# Patient Record
Sex: Male | Born: 1965
Health system: Southern US, Community
[De-identification: ages and names within clinical notes are randomized; demographics above are authoritative.]

## PROBLEM LIST (undated history)

## (undated) DIAGNOSIS — Z87442 Personal history of urinary calculi: Secondary | ICD-10-CM

## (undated) DIAGNOSIS — K5792 Diverticulitis of intestine, part unspecified, without perforation or abscess without bleeding: Secondary | ICD-10-CM

## (undated) HISTORY — PX: EYE SURGERY: SHX253

## (undated) HISTORY — PX: NO PAST SURGERIES: SHX2092

## (undated) HISTORY — PX: COLONOSCOPY: SHX174

## (undated) HISTORY — PX: REFRACTIVE SURGERY: SHX103

---

## 2000-09-15 ENCOUNTER — Encounter: Payer: Self-pay | Admitting: Gastroenterology

## 2000-09-15 ENCOUNTER — Ambulatory Visit (HOSPITAL_COMMUNITY): Admission: RE | Admit: 2000-09-15 | Discharge: 2000-09-15 | Payer: Self-pay | Admitting: Gastroenterology

## 2002-05-11 ENCOUNTER — Emergency Department (HOSPITAL_COMMUNITY): Admission: EM | Admit: 2002-05-11 | Discharge: 2002-05-11 | Payer: Self-pay | Admitting: Emergency Medicine

## 2002-05-11 ENCOUNTER — Encounter: Payer: Self-pay | Admitting: Emergency Medicine

## 2004-09-05 ENCOUNTER — Emergency Department (HOSPITAL_COMMUNITY): Admission: EM | Admit: 2004-09-05 | Discharge: 2004-09-05 | Payer: Self-pay | Admitting: Family Medicine

## 2008-09-17 ENCOUNTER — Emergency Department (HOSPITAL_COMMUNITY): Admission: EM | Admit: 2008-09-17 | Discharge: 2008-09-17 | Payer: Self-pay | Admitting: Emergency Medicine

## 2011-11-11 ENCOUNTER — Encounter: Payer: Self-pay | Admitting: Emergency Medicine

## 2011-11-11 ENCOUNTER — Other Ambulatory Visit: Payer: Self-pay | Admitting: Emergency Medicine

## 2011-11-11 ENCOUNTER — Ambulatory Visit (INDEPENDENT_AMBULATORY_CARE_PROVIDER_SITE_OTHER): Payer: 59 | Admitting: Emergency Medicine

## 2011-11-11 VITALS — BP 152/94 | HR 90 | Temp 98.3°F | Resp 16 | Ht 73.0 in | Wt 238.6 lb

## 2011-11-11 DIAGNOSIS — K635 Polyp of colon: Secondary | ICD-10-CM

## 2011-11-11 DIAGNOSIS — Z0000 Encounter for general adult medical examination without abnormal findings: Secondary | ICD-10-CM

## 2011-11-11 LAB — CBC WITH DIFFERENTIAL/PLATELET
Basophils Absolute: 0 10*3/uL (ref 0.0–0.1)
Basophils Relative: 1 % (ref 0–1)
Eosinophils Absolute: 0.1 10*3/uL (ref 0.0–0.7)
Eosinophils Relative: 4 % (ref 0–5)
HCT: 38.8 % — ABNORMAL LOW (ref 39.0–52.0)
Hemoglobin: 13.8 g/dL (ref 13.0–17.0)
Lymphocytes Relative: 48 % — ABNORMAL HIGH (ref 12–46)
Lymphs Abs: 1.7 10*3/uL (ref 0.7–4.0)
MCH: 30.7 pg (ref 26.0–34.0)
MCHC: 35.6 g/dL (ref 30.0–36.0)
MCV: 86.2 fL (ref 78.0–100.0)
Monocytes Absolute: 0.5 10*3/uL (ref 0.1–1.0)
Monocytes Relative: 14 % — ABNORMAL HIGH (ref 3–12)
Neutro Abs: 1.2 10*3/uL — ABNORMAL LOW (ref 1.7–7.7)
Neutrophils Relative %: 33 % — ABNORMAL LOW (ref 43–77)
Platelets: 219 10*3/uL (ref 150–400)
RBC: 4.5 MIL/uL (ref 4.22–5.81)
RDW: 13.6 % (ref 11.5–15.5)
WBC: 3.6 10*3/uL — ABNORMAL LOW (ref 4.0–10.5)

## 2011-11-11 LAB — LIPID PANEL
Cholesterol: 159 mg/dL (ref 0–200)
HDL: 41 mg/dL (ref >39)
LDL Cholesterol: 99 mg/dL (ref 0–99)
Total CHOL/HDL Ratio: 3.9 Ratio
Triglycerides: 93 mg/dL (ref <150)
VLDL: 19 mg/dL (ref 0–40)

## 2011-11-11 LAB — IFOBT (OCCULT BLOOD): IFOBT: NEGATIVE

## 2011-11-11 NOTE — Progress Notes (Signed)
@[email or web address] @  Patient ID: Matthew Daniel MRN: [id number], DOB: 1965-06-06 46 y.o. Date of Encounter: 11/11/2011, 5:36 PM  Primary Physician: No primary provider on file.  Chief Complaint: Physical (CPE)  HPI: 46 y.o. y/o male with history noted below here for CPE.  Doing well. No issues/complaints.  Review of Systems:  Consitutional: No fever, chills, fatigue, night sweats, lymphadenopathy, or weight changes. Eyes: No visual changes, eye redness, or discharge. ENT/Mouth: Ears: No otalgia, tinnitus, hearing loss, discharge. Nose: No congestion, rhinorrhea, sinus pain, or epistaxis. Throat: No sore throat, post nasal drip, or teeth pain. Cardiovascular: No CP, palpitations, diaphoresis, DOE, edema, orthopnea, PND. Respiratory: No cough, hemoptysis, SOB, or wheezing. Gastrointestinal: No anorexia, dysphagia, reflux, pain, nausea, vomiting, hematemesis, diarrhea, constipation, BRBPR, or melena. Genitourinary: No dysuria, frequency, urgency, hematuria, incontinence, nocturia, decreased urinary stream, discharge, impotence, or testicular pain/masses. Musculoskeletal: No decreased ROM, myalgias, stiffness, joint swelling, or weakness. Skin: No rash, erythema, lesion changes, pain, warmth, jaundice, or pruritis. Neurological: No headache, dizziness, syncope, seizures, tremors, memory loss, coordination problems, or paresthesias. Psychological: No anxiety, depression, hallucinations, SI/HI. Endocrine: No fatigue, polydipsia, polyphagia, polyuria, or known diabetes. All other systems were reviewed and are otherwise negative.  History reviewed. No pertinent past medical history.   History reviewed. No pertinent past surgical history.  Home Meds:  Prior to Admission medications   Not on File    Allergies:  Allergies  Allergen Reactions  . Sulfa Antibiotics     Hives    History   Social History  . Marital Status: Single    Spouse Name: N/A    Number of Children: N/A  . Years of  Education: N/A   Occupational History  . Not on file.   Social History Main Topics  . Smoking status: Never Smoker   . Smokeless tobacco: Not on file  . Alcohol Use: No  . Drug Use: No  . Sexually Active: Not on file   Other Topics Concern  . Not on file   Social History Narrative  . No narrative on file    History reviewed. No pertinent family history.  Physical Exam:  Blood pressure 152/94, pulse 90, temperature 98.3 F (36.8 C), temperature source Oral, resp. rate 16, height 6\' 1"  (1.854 m), weight 238 lb 9.6 oz (108.228 kg), SpO2 97.00%.  General: Well developed, well nourished, in no acute distress. HEENT: Normocephalic, atraumatic. Conjunctiva pink, sclera non-icteric. Pupils 2 mm constricting to 1 mm, round, regular, and equally reactive to light and accomodation. EOMI. Internal auditory canal clear. TMs with good cone of light and without pathology. Nasal mucosa pink. Nares are without discharge. No sinus tenderness. Oral mucosa pink. Dentition normal. Pharynx without exudate.   Neck: Supple. Trachea midline. No thyromegaly. Full ROM. No lymphadenopathy. Lungs: Clear to auscultation bilaterally without wheezes, rales, or rhonchi. Breathing is of normal effort and unlabored. Cardiovascular: RRR with S1 S2. No murmurs, rubs, or gallops appreciated. Distal pulses 2+ symmetrically. No carotid or abdominal bruits.  Abdomen: Soft, non-tender, non-distended with normoactive bowel sounds. No hepatosplenomegaly or masses. No rebound/guarding. No CVA tenderness. Without hernias.  Rectal: No external hemorrhoids or fissures. Rectal vault without masses.   Genitourinary:   circumcised male. No penile lesions. Testes descended bilaterally, and smooth without tenderness or masses.  Musculoskeletal: Full range of motion and 5/5 strength throughout. Without swelling, atrophy, tenderness, crepitus, or warmth. Extremities without clubbing, cyanosis, or edema. Calves supple. Skin: Warm and  moist without erythema, ecchymosis, wounds, or rash. Neuro: A+Ox3. CN II-XII grossly  intact. Moves all extremities spontaneously. Full sensation throughout. Normal gait. DTR 2+ throughout upper and lower extremities. Finger to nose intact. Psych:  Responds to questions appropriately with a normal affect.  The patient had 2 mm skin tenderness mid abdomen. This area was prepped with Betadine with half cc of 2% plain removed with iris scissors with the base cauterized with silver nitrate. The lesion was not sent. This was discussed with the patient and he was very comfortable without having lesion sent for pathology. Patient tolerated the procedure well Studies: CBC, CMET, Lipid, PSA, TSH,  all pending.   Assessment/Plan:  46 y.o. y/o  male here for CPE. His physical exam was normal. I did remove a small skin tag from his midabdomen. He has a history of colon polyps and has not had followup an appointment was made with Dr. Matthias Hughs. Patient knows his weight is too high and he is to exercise more. He is no other high-risk behaviors does not smoke drink and is not sexually active. -  Signed, Earl Lites, MD 11/11/2011 5:36 PM

## 2011-11-12 LAB — TSH: TSH: 1.145 u[IU]/mL (ref 0.350–4.500)

## 2011-11-12 LAB — HIV ANTIBODY (ROUTINE TESTING W REFLEX): HIV: NONREACTIVE

## 2011-11-12 LAB — PSA: PSA: 0.72 ng/mL (ref <=4.00)

## 2012-03-27 ENCOUNTER — Other Ambulatory Visit: Payer: Self-pay

## 2012-05-14 ENCOUNTER — Encounter: Payer: Self-pay | Admitting: Emergency Medicine

## 2012-10-14 ENCOUNTER — Emergency Department (HOSPITAL_COMMUNITY)
Admission: EM | Admit: 2012-10-14 | Discharge: 2012-10-14 | Disposition: A | Discharge status: Home / Self Care | Payer: 59 | Attending: Emergency Medicine | Admitting: Emergency Medicine

## 2012-10-14 ENCOUNTER — Encounter (HOSPITAL_COMMUNITY): Payer: Self-pay | Admitting: Emergency Medicine

## 2012-10-14 ENCOUNTER — Emergency Department (HOSPITAL_COMMUNITY): Payer: 59

## 2012-10-14 DIAGNOSIS — K5732 Diverticulitis of large intestine without perforation or abscess without bleeding: Secondary | ICD-10-CM | POA: Insufficient documentation

## 2012-10-14 DIAGNOSIS — K5792 Diverticulitis of intestine, part unspecified, without perforation or abscess without bleeding: Secondary | ICD-10-CM

## 2012-10-14 LAB — URINALYSIS, ROUTINE W REFLEX MICROSCOPIC
Glucose, UA: NEGATIVE mg/dL
Ketones, ur: 15 mg/dL — AB
Nitrite: POSITIVE — AB
Protein, ur: 30 mg/dL — AB
Specific Gravity, Urine: 1.028 (ref 1.005–1.030)
Urobilinogen, UA: 1 mg/dL (ref 0.0–1.0)
pH: 5.5 (ref 5.0–8.0)

## 2012-10-14 LAB — URINE MICROSCOPIC-ADD ON

## 2012-10-14 LAB — CBC WITH DIFFERENTIAL/PLATELET
Basophils Absolute: 0 10*3/uL (ref 0.0–0.1)
Basophils Relative: 0 % (ref 0–1)
Eosinophils Absolute: 0 10*3/uL (ref 0.0–0.7)
Eosinophils Relative: 0 % (ref 0–5)
HCT: 42.6 % (ref 39.0–52.0)
Hemoglobin: 15.2 g/dL (ref 13.0–17.0)
Lymphocytes Relative: 16 % (ref 12–46)
Lymphs Abs: 2.4 10*3/uL (ref 0.7–4.0)
MCH: 30.6 pg (ref 26.0–34.0)
MCHC: 35.7 g/dL (ref 30.0–36.0)
MCV: 85.9 fL (ref 78.0–100.0)
Monocytes Absolute: 2.5 10*3/uL — ABNORMAL HIGH (ref 0.1–1.0)
Monocytes Relative: 16 % — ABNORMAL HIGH (ref 3–12)
Neutro Abs: 10.3 10*3/uL — ABNORMAL HIGH (ref 1.7–7.7)
Neutrophils Relative %: 68 % (ref 43–77)
Platelets: 208 10*3/uL (ref 150–400)
RBC: 4.96 MIL/uL (ref 4.22–5.81)
RDW: 13.2 % (ref 11.5–15.5)
WBC: 15.3 10*3/uL — ABNORMAL HIGH (ref 4.0–10.5)

## 2012-10-14 LAB — COMPREHENSIVE METABOLIC PANEL
ALT: 35 U/L (ref 0–53)
AST: 21 U/L (ref 0–37)
Albumin: 4 g/dL (ref 3.5–5.2)
Alkaline Phosphatase: 55 U/L (ref 39–117)
BUN: 15 mg/dL (ref 6–23)
CO2: 22 mEq/L (ref 19–32)
Calcium: 8.9 mg/dL (ref 8.4–10.5)
Chloride: 100 mEq/L (ref 96–112)
Creatinine, Ser: 1.47 mg/dL — ABNORMAL HIGH (ref 0.50–1.35)
GFR calc Af Amer: 64 mL/min — ABNORMAL LOW (ref >90)
GFR calc non Af Amer: 56 mL/min — ABNORMAL LOW (ref >90)
Glucose, Bld: 118 mg/dL — ABNORMAL HIGH (ref 70–99)
Potassium: 3.7 mEq/L (ref 3.5–5.1)
Sodium: 137 mEq/L (ref 135–145)
Total Bilirubin: 1.6 mg/dL — ABNORMAL HIGH (ref 0.3–1.2)
Total Protein: 7.5 g/dL (ref 6.0–8.3)

## 2012-10-14 LAB — LIPASE, BLOOD: Lipase: 30 U/L (ref 11–59)

## 2012-10-14 MED ORDER — ONDANSETRON HCL 4 MG/2ML IJ SOLN
4.0000 mg | Freq: Once | INTRAMUSCULAR | Status: AC
  Administered 2012-10-14: 4 mg via INTRAVENOUS

## 2012-10-14 MED ORDER — METRONIDAZOLE IN NACL 5-0.79 MG/ML-% IV SOLN
500.0000 mg | Freq: Once | INTRAVENOUS | Status: AC
  Administered 2012-10-14: 500 mg via INTRAVENOUS
  Filled 2012-10-14: qty 100

## 2012-10-14 MED ORDER — CIPROFLOXACIN HCL 500 MG PO TABS: 500.0000 mg | ORAL_TABLET | Freq: Two times a day (BID) | ORAL | Status: DC

## 2012-10-14 MED ORDER — FENTANYL CITRATE 0.05 MG/ML IJ SOLN
50.0000 ug | Freq: Once | INTRAMUSCULAR | Status: AC
  Administered 2012-10-14: 50 ug via INTRAVENOUS
  Filled 2012-10-14: qty 2

## 2012-10-14 MED ORDER — MORPHINE SULFATE 4 MG/ML IJ SOLN
4.0000 mg | Freq: Once | INTRAMUSCULAR | Status: AC
  Administered 2012-10-14: 4 mg via INTRAVENOUS

## 2012-10-14 MED ORDER — IOHEXOL 300 MG/ML  SOLN
25.0000 mL | INTRAMUSCULAR | Status: AC
  Administered 2012-10-14: 25 mL via ORAL

## 2012-10-14 MED ORDER — ONDANSETRON 4 MG PO TBDP: 4.0000 mg | ORAL_TABLET | Freq: Three times a day (TID) | ORAL | Status: DC | PRN

## 2012-10-14 MED ORDER — CIPROFLOXACIN IN D5W 400 MG/200ML IV SOLN
400.0000 mg | Freq: Once | INTRAVENOUS | Status: AC
  Administered 2012-10-14: 400 mg via INTRAVENOUS
  Filled 2012-10-14: qty 200

## 2012-10-14 MED ORDER — METRONIDAZOLE 500 MG PO TABS: 500.0000 mg | ORAL_TABLET | Freq: Two times a day (BID) | ORAL | Status: DC

## 2012-10-14 MED ORDER — OXYCODONE-ACETAMINOPHEN 5-325 MG PO TABS: 2.0000 | ORAL_TABLET | ORAL | Status: DC | PRN

## 2012-10-14 MED ORDER — IOHEXOL 300 MG/ML  SOLN
100.0000 mL | Freq: Once | INTRAMUSCULAR | Status: AC | PRN
  Administered 2012-10-14: 100 mL via INTRAVENOUS

## 2012-10-14 NOTE — ED Provider Notes (Signed)
CSN: [id number]     Arrival date & time 10/14/12  0134 History   First MD Initiated Contact with Patient 10/14/12 [phone]     Chief Complaint  Patient presents with  . Abdominal Pain   (Consider location/radiation/quality/duration/timing/severity/associated sxs/prior Treatment) HPI 47 year old male presents to emergency room complaining of onset of lower perimbilical abdominal pain starting yesterday.  He reports normal bowel movement yesterday.  Patient ate a hotdog from a gas station about 2 days ago, and thinks he may have food poisoning.  No nausea no vomiting until arriving in the emergency department tonight.  Pain has been sharp, and severe.  No prior abdominal surgeries.  He reports colonoscopy about 5-6 months ago, which showed diverticulosis and a polyp.  No urinary symptoms, no penile discharge  History reviewed. No pertinent past medical history. Past Surgical History  Procedure Laterality Date  . Refractive surgery     No family history on file. History  Substance Use Topics  . Smoking status: Never Smoker   . Smokeless tobacco: Not on file  . Alcohol Use: No    Review of Systems  All other systems reviewed and are negative.    Allergies  Sulfa antibiotics  Home Medications   Current Outpatient Rx  Name  Route  Sig  Dispense  Refill  . Polyethyl Glycol-Propyl Glycol (SYSTANE PRESERVATIVE FREE) 0.4-0.3 % SOLN   Ophthalmic   Apply 1 drop to eye every hour as needed (dry eyes).          There were no vitals taken for this visit. Physical Exam  Nursing note and vitals reviewed. Constitutional: He is oriented to person, place, and time. He appears well-developed and well-nourished. He appears distressed.  HENT:  Head: Normocephalic and atraumatic.  Nose: Nose normal.  Mouth/Throat: Oropharynx is clear and moist.  Eyes: Conjunctivae and EOM are normal. Pupils are equal, round, and reactive to light.  Neck: Normal range of motion. Neck supple. No JVD present. No  tracheal deviation present. No thyromegaly present.  Cardiovascular: Normal rate, regular rhythm, normal heart sounds and intact distal pulses.  Exam reveals no gallop and no friction rub.   No murmur heard. Pulmonary/Chest: Effort normal and breath sounds normal. No stridor. No respiratory distress. He has no wheezes. He has no rales. He exhibits no tenderness.  Abdominal: Soft. Bowel sounds are normal. He exhibits no distension and no mass. There is tenderness. There is rebound. There is no guarding.  Patient with diffuse abdominal pain to lower abdomen, worse on the right lower quadrant and periumbilical.  He has some mild rebound.  No guarding  Musculoskeletal: Normal range of motion. He exhibits no edema and no tenderness.  Lymphadenopathy:    He has no cervical adenopathy.  Neurological: He is alert and oriented to person, place, and time. He exhibits normal muscle tone. Coordination normal.  Skin: Skin is warm and dry. No rash noted. No erythema. No pallor.  Psychiatric: He has a normal mood and affect. His behavior is normal. Judgment and thought content normal.    ED Course  Procedures (including critical care time) Labs Review Labs Reviewed  CBC WITH DIFFERENTIAL - Abnormal; Notable for the following:    WBC 15.3 (*)    Neutro Abs 10.3 (*)    Monocytes Relative 16 (*)    Monocytes Absolute 2.5 (*)    All other components within normal limits  COMPREHENSIVE METABOLIC PANEL - Abnormal; Notable for the following:    Glucose, Bld 118 (*)  Creatinine, Ser 1.47 (*)    Total Bilirubin 1.6 (*)    GFR calc non Af Amer 56 (*)    GFR calc Af Amer 64 (*)    All other components within normal limits  URINALYSIS, ROUTINE W REFLEX MICROSCOPIC - Abnormal; Notable for the following:    Color, Urine AMBER (*)    APPearance CLOUDY (*)    Hgb urine dipstick MODERATE (*)    Bilirubin Urine SMALL (*)    Ketones, ur 15 (*)    Protein, ur 30 (*)    Nitrite POSITIVE (*)    Leukocytes, UA  SMALL (*)    All other components within normal limits  URINE MICROSCOPIC-ADD ON - Abnormal; Notable for the following:    Bacteria, UA FEW (*)    Casts GRANULAR CAST (*)    All other components within normal limits  URINE CULTURE  LIPASE, BLOOD   Imaging Review Ct Abdomen Pelvis W Contrast  10/14/2012   *RADIOLOGY REPORT*  Clinical Data: Lower abdominal pain.  Concern for appendicitis.  CT ABDOMEN AND PELVIS WITH CONTRAST  Technique:  Multidetector CT imaging of the abdomen and pelvis was performed following the standard protocol during bolus administration of intravenous contrast.  Contrast: OMNIPAQUE IOHEXOL 300 MG/ML  SOLN  Comparison: 07/08/2007.  Findings:  BODY WALL: Unremarkable.  LOWER CHEST:  Mediastinum: Unremarkable.  Lungs/pleura: No consolidation.  ABDOMEN/PELVIS:  Liver: Diffuse low attenuation consistent with steatosis.  Tiny low attenuation focus in the dome of the right lobe, nonspecific.  Biliary: No evidence of biliary obstruction or stone.  Pancreas: Unremarkable.  Spleen: Unremarkable.  Adrenals: Unremarkable.  Kidneys and ureters: No hydronephrosis or stone. Sub centimeter low attenuation foci in the lower pole right kidney are too small to characterize.  Small focus of interpolar right renal scarring.  Bladder: Unremarkable.  Bowel: Focal sigmoid colonic wall thickening with infiltration of the neighboring fat.  This is in a region with multiple diverticula.  No extraluminal gas or abscess.  No bowel obstruction. Normal appendix.  Retroperitoneum: No mass or adenopathy.  Reproductive: Unremarkable.  Vascular: No acute abnormality.  OSSEOUS: No acute abnormalities.  IMPRESSION: 1. Sigmoid diverticulitis without abscess or perforation.  2. Hepatic steatosis.   Original Report Authenticated By: Tiburcio Pea    MDM   1. Diverticulitis     47 year old male with worsening lower abdominal pain.  Differential includes appendicitis, diverticulitis, colitis, pylonephritis.  He  has elevated wbc, and urine showing possible infection.  Will get CT abd pelvis    Olivia Mackie, MD 10/14/12 [phone]

## 2012-10-14 NOTE — ED Notes (Signed)
Pt reports generalized abdominal pain after eating hot dog sandwich sold at a gas station 2 days ago , denies emesis or diarrhea.

## 2012-10-14 NOTE — ED Notes (Signed)
Pt returned from CT °

## 2012-10-15 LAB — URINE CULTURE: Colony Count: 5000

## 2012-12-16 ENCOUNTER — Other Ambulatory Visit: Payer: Self-pay

## 2013-07-26 ENCOUNTER — Ambulatory Visit (INDEPENDENT_AMBULATORY_CARE_PROVIDER_SITE_OTHER): Payer: 59 | Admitting: Family Medicine

## 2013-07-26 ENCOUNTER — Encounter: Payer: Self-pay | Admitting: Family Medicine

## 2013-07-26 VITALS — BP 134/82 | HR 77 | Temp 98.3°F | Resp 18 | Ht 72.5 in | Wt 235.0 lb

## 2013-07-26 DIAGNOSIS — R109 Unspecified abdominal pain: Secondary | ICD-10-CM

## 2013-07-26 DIAGNOSIS — D649 Anemia, unspecified: Secondary | ICD-10-CM

## 2013-07-26 DIAGNOSIS — K612 Anorectal abscess: Secondary | ICD-10-CM

## 2013-07-26 DIAGNOSIS — K610 Anal abscess: Secondary | ICD-10-CM

## 2013-07-26 LAB — POCT CBC
Granulocyte percent: 53.6 %G (ref 37–80)
HCT, POC: 40.1 % — AB (ref 43.5–53.7)
Hemoglobin: 13 g/dL — AB (ref 14.1–18.1)
Lymph, poc: 2.2 (ref 0.6–3.4)
MCH, POC: 30.4 pg (ref 27–31.2)
MCHC: 32.4 g/dL (ref 31.8–35.4)
MCV: 93.7 fL (ref 80–97)
MID (cbc): 0.5 (ref 0–0.9)
MPV: 8.1 fL (ref 0–99.8)
POC Granulocyte: 3.1 (ref 2–6.9)
POC LYMPH PERCENT: 37.9 %L (ref 10–50)
POC MID %: 8.5 %M (ref 0–12)
Platelet Count, POC: 266 10*3/uL (ref 142–424)
RBC: 4.28 M/uL — AB (ref 4.69–6.13)
RDW, POC: 13.8 %
WBC: 5.8 10*3/uL (ref 4.6–10.2)

## 2013-07-26 MED ORDER — AMOXICILLIN-POT CLAVULANATE 875-125 MG PO TABS: 1.0000 | ORAL_TABLET | Freq: Two times a day (BID) | ORAL | Status: DC

## 2013-07-26 NOTE — Patient Instructions (Addendum)
You have a draining, small abscess near your anus.  This may have developed from a hemorrhoid.  In any case you had already started to drain, and we opened it up a bit more for easier drainage. Use the augmentin antibiotic and do warm tub soaks a few times a day.  Pat dry gently afterwards.  Keep your stool soft and do not strain to have a BM. Let me or Dr. Sarina Ser know if you are not feeling better soon!

## 2013-07-26 NOTE — Progress Notes (Signed)
[facility] [address] [place] [address] [phone]  Date:  07/26/2013   Name:  Matthew Daniel   DOB:  02/03/1966   MRN:  [id number]  PCP:  No PCP Per Patient    Chief Complaint: Hemorrhoids   History of Present Illness:  Matthew Daniel is a 48 y.o. very pleasant male patient who presents with the following:  He is here today with a painful hemorrhoid.  He noted some bleeding just today- trace of bright red blood on TP when he wiped.  No pain with BM.  He had a "dull pain," but got worse about one week ago.  He did exercise yesterday and felt much worse afterwards.   He is generally healthy His GI doctor is Dr. Watt Climes- he had an early colonoscopy due to constipation.   He has not had any abdominal pain or nausea/ vomiting.  He did have a "flare up" of his diverticulitis about a week ago.  He has these sx on occasion.  He went on a soft diet and his sx cleared up.   He has not noted a fever No diarrhea. He is still able to have a BM ok.  He has not been constipated here recently.    Patient Active Problem List   Diagnosis Date Noted  . Colon polyps 11/11/2011    No past medical history on file.  Past Surgical History  Procedure Laterality Date  . Refractive surgery    . Eye surgery      History  Substance Use Topics  . Smoking status: Never Smoker   . Smokeless tobacco: Not on file  . Alcohol Use: No    History reviewed. No pertinent family history.  Allergies  Allergen Reactions  . Sulfa Antibiotics Hives    Medication list has been reviewed and updated.  No current outpatient prescriptions on file prior to visit.   No current facility-administered medications on file prior to visit.    Review of Systems:  As per HPI- otherwise negative.   Physical Examination: Filed Vitals:   07/26/13 1439  BP: 134/82  Pulse: 77  Temp: 98.3 F (36.8 C)  Resp: 18   Filed Vitals:   07/26/13 1439  Height: 6' 0.5" (1.842 m)   Weight: 235 lb (106.595 kg)   Body mass index is 31.42 kg/(m^2). Ideal Body Weight: Weight in (lb) to have BMI = 25: 186.5  GEN: WDWN, NAD, Non-toxic, A & O x 3, overweight, looks well HEENT: Atraumatic, Normocephalic. Neck supple. No masses, No LAD. Ears and Nose: No external deformity. CV: RRR, No M/G/R. No JVD. No thrill. No extra heart sounds. PULM: CTA B, no wheezes, crackles, rhonchi. No retractions. No resp. distress. No accessory muscle use. ABD: S, ND +BS. No rebound. No HSM.  He does have minimal tenderness in the epigastric and RLQ areas.   EXTR: No c/c/e NEURO Normal gait.  PSYCH: Normally interactive. Conversant. Not depressed or anxious appearing.  Calm demeanor.  Rectal: he has a small, already draining abscess at 12:00 to the anus, not involving the anal sphincter.  Rectal exam does not reveal any internal hemorrhoids or tracking.    VC obtained.  Area prepped with peroxide and alcohol.  Small amount of 1% lidocaein used to numb area, I and D with 11 blade. Tolerated well   Results for orders placed in visit on 07/26/13  POCT CBC      Result Value Ref Range   WBC  5.8  4.6 - 10.2 K/uL   Lymph, poc 2.2  0.6 - 3.4   POC LYMPH PERCENT 37.9  10 - 50 %L   MID (cbc) 0.5  0 - 0.9   POC MID % 8.5  0 - 12 %M   POC Granulocyte 3.1  2 - 6.9   Granulocyte percent 53.6  37 - 80 %G   RBC 4.28 (*) 4.69 - 6.13 M/uL   Hemoglobin 13.0 (*) 14.1 - 18.1 g/dL   HCT, POC 40.1 (*) 43.5 - 53.7 %   MCV 93.7  80 - 97 fL   MCH, POC 30.4  27 - 31.2 pg   MCHC 32.4  31.8 - 35.4 g/dL   RDW, POC 13.8     Platelet Count, POC 266  142 - 424 K/uL   MPV 8.1  0 - 99.8 fL    Assessment and Plan: Perianal abscess - Plan: amoxicillin-clavulanate (AUGMENTIN) 875-125 MG per tablet  Abdominal pain, other specified site - Plan: POCT CBC  Here today with a small peri-anal abscess.  Already draining some, performed I and D to assist with further drainage.  Will treat with augmentin, sitz baths.  He  will let me know if not doing well.  Recent "diverticulitis flare" which he dx and treated at home, now with mild abdominal tenderness as above.  His normal CBC is reassuring but not a guarantee of benign process.  Offered a CT scan to absolutely rule- out appendicitis or severe diverticulitis.  He declines for now but will keep a close eye on his sx and seek care if not continuing to improve  Signed Lamar Blinks, MD

## 2013-09-03 ENCOUNTER — Ambulatory Visit (INDEPENDENT_AMBULATORY_CARE_PROVIDER_SITE_OTHER): Payer: 59 | Admitting: Family Medicine

## 2013-09-03 VITALS — BP 144/78 | HR 76 | Temp 98.1°F | Resp 18 | Ht 72.0 in | Wt 242.4 lb

## 2013-09-03 DIAGNOSIS — K5900 Constipation, unspecified: Secondary | ICD-10-CM

## 2013-09-03 DIAGNOSIS — K625 Hemorrhage of anus and rectum: Secondary | ICD-10-CM

## 2013-09-03 DIAGNOSIS — K612 Anorectal abscess: Secondary | ICD-10-CM

## 2013-09-03 NOTE — Progress Notes (Signed)
Subjective:    Patient ID: Matthew Daniel, male    DOB: April 17, 1965, 48 y.o.   MRN: [id number]  HPI Chief Complaint  Patient presents with  . Rectal Pain    had an anal abscess drained last time he was here, says hes having irritation and pain, also blood when he wipes    This chart was scribed for Matthew Ray, MD by Thea Alken, ED Scribe. This patient was seen in room 1 and the patient's care was started at 9:03 AM.  HPI Comments: Matthew Daniel is a 48 y.o. male with who presents to the [facility] complaining of rectal pain. Pt states he was seen 4-5 weeks ago by Dr. Edilia Bo for anal abscess which was drained at that time. He reports since drainage he's had a tinge of blood with wiping without blood clots. He reports worsening heavy rectal bleeding 1 days ago along with constipation. He reports rectal area is sore. Pt states he has been using wet flushable wipes for area. Pt report he has miralax at home for frequent constipation. Pt denies taking miralax for constipation as of lately. Pt reports h/o diverticulitis and was last seen by GI, Dr, Watt Climes in September. Pt denies fever chills nausea and emesis.   Pt was seen June 16 for a small draining abscess at 12 o'clock which did not involve anal fincture. On exam no apparent tracking or hemorrhoids. Pt was treated with Augmentin after I&D and futher drainage.   Patient Active Problem List   Diagnosis Date Noted  . Colon polyps 11/11/2011   History reviewed. No pertinent past medical history. Past Surgical History  Procedure Laterality Date  . Refractive surgery    . Eye surgery     Allergies  Allergen Reactions  . Sulfa Antibiotics Hives   Prior to Admission medications   Not on File   History   Social History  . Marital Status: Single    Spouse Name: N/A    Number of Children: N/A  . Years of Education: N/A   Occupational History  . Not on file.   Social History Main Topics  .  Smoking status: Never Smoker   . Smokeless tobacco: Not on file  . Alcohol Use: No  . Drug Use: No  . Sexual Activity: Not on file   Other Topics Concern  . Not on file   Social History Narrative  . No narrative on file   Review of Systems  Constitutional: Negative for fever and chills.  Gastrointestinal: Positive for constipation, anal bleeding and rectal pain. Negative for nausea, vomiting and abdominal pain.       Objective:   Physical Exam  Nursing note and vitals reviewed. Constitutional: He is oriented to person, place, and time. He appears well-developed and well-nourished. No distress.  HENT:  Head: Normocephalic and atraumatic.  Eyes: Conjunctivae and EOM are normal.  Neck: Neck supple.  Cardiovascular: Normal rate.   Pulmonary/Chest: Effort normal.  Abdominal: He exhibits no distension. There is no tenderness.  Genitourinary: Rectal exam shows no fissure.  12 o'clock 1 cm above anus there is a small opening with dried blood but no surrounding induration, erythema, or discharge. No fistula noted with digital rectal exam. no bleeding around anus.   Musculoskeletal: Normal range of motion.  Neurological: He is alert and oriented to person, place, and time.  Skin: Skin is warm and dry.  Psychiatric: He has a normal mood and affect. His behavior is normal.  Filed Vitals:   09/03/13 0840  BP: 144/78  Pulse: 76  Temp: 98.1 F (36.7 C)  TempSrc: Oral  Resp: 18  Height: 6' (1.829 m)  Weight: 242 lb 6.4 oz (109.952 kg)  SpO2: 98%      Assessment & Plan:  DACOTA RUBEN is a 48 y.o. male Rectal bleeding - Plan: Ambulatory referral to General Surgery  Abscess of anal or rectal region - Plan: Ambulatory referral to General Surgery  Unspecified constipation - Plan: Ambulatory referral to General Surgery  Hx of constipation, recent small amount of blood with wiping, increased today, but painless rectal bleeding. Prior area of abscess with small residual  opening, but no deeper signs of involvement on exam today, as no induration/fluctuance or discharge form area. No fistula palpated on exam.  DDX also includes other common causes of rectal bleeding with constipation like anal fissure or internal hemorrhoid.   - colace or miralax for constipation, avoid straining.   - sitz baths if needed  -refer to general surgery for eval of area, as prior abscess.   -rtc precautions.    No orders of the defined types were placed in this encounter.   Patient Instructions  We will refer you to general surgeon next week.  Warm soaks if needed, return if any increased pain, swelling or discharge in this area. Colace or Miralax if needed for constipation to lessen straining.  Return to the clinic or go to the nearest emergency room if any of your symptoms worsen or new symptoms occur.  Constipation Constipation is when a person has fewer than three bowel movements a week, has difficulty having a bowel movement, or has stools that are dry, hard, or larger than normal. As people grow older, constipation is more common. If you try to fix constipation with medicines that make you have a bowel movement (laxatives), the problem may get worse. Long-term laxative use may cause the muscles of the colon to become weak. A low-fiber diet, not taking in enough fluids, and taking certain medicines may make constipation worse.  CAUSES   Certain medicines, such as antidepressants, pain medicine, iron supplements, antacids, and water pills.   Certain diseases, such as diabetes, irritable bowel syndrome (IBS), thyroid disease, or depression.   Not drinking enough water.   Not eating enough fiber-rich foods.   Stress or travel.   Lack of physical activity or exercise.   Ignoring the urge to have a bowel movement.   Using laxatives too much.  SIGNS AND SYMPTOMS   Having fewer than three bowel movements a week.   Straining to have a bowel movement.   Having  stools that are hard, dry, or larger than normal.   Feeling full or bloated.   Pain in the lower abdomen.   Not feeling relief after having a bowel movement.  DIAGNOSIS  Your health care provider will take a medical history and perform a physical exam. Further testing may be done for severe constipation. Some tests may include:  A barium enema X-Daniel to examine your rectum, colon, and, sometimes, your small intestine.   A sigmoidoscopy to examine your lower colon.   A colonoscopy to examine your entire colon. TREATMENT  Treatment will depend on the severity of your constipation and what is causing it. Some dietary treatments include drinking more fluids and eating more fiber-rich foods. Lifestyle treatments may include regular exercise. If these diet and lifestyle recommendations do not help, your health care provider may recommend taking over-the-counter laxative  medicines to help you have bowel movements. Prescription medicines may be prescribed if over-the-counter medicines do not work.  HOME CARE INSTRUCTIONS   Eat foods that have a lot of fiber, such as fruits, vegetables, whole grains, and beans.  Limit foods high in fat and processed sugars, such as french fries, hamburgers, cookies, candies, and soda.   A fiber supplement may be added to your diet if you cannot get enough fiber from foods.   Drink enough fluids to keep your urine clear or pale yellow.   Exercise regularly or as directed by your health care provider.   Go to the restroom when you have the urge to go. Do not hold it.   Only take over-the-counter or prescription medicines as directed by your health care provider. Do not take other medicines for constipation without talking to your health care provider first.  [place] IF:   You have bright red blood in your stool.   Your constipation lasts for more than 4 days or gets worse.   You have abdominal or rectal pain.   You have  thin, pencil-like stools.   You have unexplained weight loss. MAKE SURE YOU:   Understand these instructions.  Will watch your condition.  Will get help right away if you are not doing well or get worse. Document Released: 10/26/2003 Document Revised: 02/01/2013 Document Reviewed: 11/08/2012 [facility] Patient Information 2015 [place], [place]. This information is not intended to replace advice given to you by your health care provider. Make sure you discuss any questions you have with your health care provider.   Rectal Bleeding Rectal bleeding is when blood passes out of the anus. It is usually a sign that something is wrong. It may not be serious, but it should always be evaluated. Rectal bleeding may present as bright red blood or extremely dark stools. The color may range from dark red or maroon to black (like tar). It is important that the cause of rectal bleeding be identified so treatment can be started and the problem corrected. CAUSES   Hemorrhoids. These are enlarged (dilated) blood vessels or veins in the anal or rectal area.  Fistulas. Theseare abnormal, burrowing channels that usually run from inside the rectum to the skin around the anus. They can bleed.  Anal fissures. This is a tear in the tissue of the anus. Bleeding occurs with bowel movements.  Diverticulosis. This is a condition in which pockets or sacs project from the bowel wall. Occasionally, the sacs can bleed.  Diverticulitis. Thisis an infection involving diverticulosis of the colon.  Proctitis and colitis. These are conditions in which the rectum, colon, or both, can become inflamed and pitted (ulcerated).  Polyps and cancer. Polyps are non-cancerous (benign) growths in the colon that may bleed. Certain types of polyps turn into cancer.  Protrusion of the rectum. Part of the rectum can project from the anus and bleed.  Certain medicines.  Intestinal infections.  Blood vessel abnormalities. HOME CARE  INSTRUCTIONS  Eat a high-fiber diet to keep your stool soft.  Limit activity.  Drink enough fluids to keep your urine clear or pale yellow.  Warm baths may be useful to soothe rectal pain.  Follow up with your caregiver as directed. SEEK IMMEDIATE MEDICAL CARE IF:  You develop increased bleeding.  You have black or dark red stools.  You vomit blood or material that looks like coffee grounds.  You have abdominal pain or tenderness.  You have a fever.  You feel  weak, nauseous, or you faint.  You have severe rectal pain or you are unable to have a bowel movement. MAKE SURE YOU:  Understand these instructions.  Will watch your condition.  Will get help right away if you are not doing well or get worse. Document Released: 07/19/2001 Document Revised: 04/21/2011 Document Reviewed: 07/14/2010 [facility] Patient Information 2015 [place], [place]. This information is not intended to replace advice given to you by your health care provider. Make sure you discuss any questions you have with your health care provider.     I personally performed the services described in this documentation, which was scribed in my presence. The recorded information has been reviewed and considered, and addended by me as needed.

## 2013-09-03 NOTE — Patient Instructions (Signed)
We will refer you to general surgeon next week.  Warm soaks if needed, return if any increased pain, swelling or discharge in this area. Colace or Miralax if needed for constipation to lessen straining.  Return to the clinic or go to the nearest emergency room if any of your symptoms worsen or new symptoms occur.  Constipation Constipation is when a person has fewer than three bowel movements a week, has difficulty having a bowel movement, or has stools that are dry, hard, or larger than normal. As people grow older, constipation is more common. If you try to fix constipation with medicines that make you have a bowel movement (laxatives), the problem may get worse. Long-term laxative use may cause the muscles of the colon to become weak. A low-fiber diet, not taking in enough fluids, and taking certain medicines may make constipation worse.  CAUSES   Certain medicines, such as antidepressants, pain medicine, iron supplements, antacids, and water pills.   Certain diseases, such as diabetes, irritable bowel syndrome (IBS), thyroid disease, or depression.   Not drinking enough water.   Not eating enough fiber-rich foods.   Stress or travel.   Lack of physical activity or exercise.   Ignoring the urge to have a bowel movement.   Using laxatives too much.  SIGNS AND SYMPTOMS   Having fewer than three bowel movements a week.   Straining to have a bowel movement.   Having stools that are hard, dry, or larger than normal.   Feeling full or bloated.   Pain in the lower abdomen.   Not feeling relief after having a bowel movement.  DIAGNOSIS  Your health care provider will take a medical history and perform a physical exam. Further testing may be done for severe constipation. Some tests may include:  A barium enema X-ray to examine your rectum, colon, and, sometimes, your small intestine.   A sigmoidoscopy to examine your lower colon.   A colonoscopy to examine your  entire colon. TREATMENT  Treatment will depend on the severity of your constipation and what is causing it. Some dietary treatments include drinking more fluids and eating more fiber-rich foods. Lifestyle treatments may include regular exercise. If these diet and lifestyle recommendations do not help, your health care provider may recommend taking over-the-counter laxative medicines to help you have bowel movements. Prescription medicines may be prescribed if over-the-counter medicines do not work.  HOME CARE INSTRUCTIONS   Eat foods that have a lot of fiber, such as fruits, vegetables, whole grains, and beans.  Limit foods high in fat and processed sugars, such as french fries, hamburgers, cookies, candies, and soda.   A fiber supplement may be added to your diet if you cannot get enough fiber from foods.   Drink enough fluids to keep your urine clear or pale yellow.   Exercise regularly or as directed by your health care provider.   Go to the restroom when you have the urge to go. Do not hold it.   Only take over-the-counter or prescription medicines as directed by your health care provider. Do not take other medicines for constipation without talking to your health care provider first.  [place] IF:   You have bright red blood in your stool.   Your constipation lasts for more than 4 days or gets worse.   You have abdominal or rectal pain.   You have thin, pencil-like stools.   You have unexplained weight loss. MAKE SURE YOU:   Understand these  instructions.  Will watch your condition.  Will get help right away if you are not doing well or get worse. Document Released: 10/26/2003 Document Revised: 02/01/2013 Document Reviewed: 11/08/2012 [facility] Patient Information 2015 [place], [place]. This information is not intended to replace advice given to you by your health care provider. Make sure you discuss any questions you have with your health care  provider.   Rectal Bleeding Rectal bleeding is when blood passes out of the anus. It is usually a sign that something is wrong. It may not be serious, but it should always be evaluated. Rectal bleeding may present as bright red blood or extremely dark stools. The color may range from dark red or maroon to black (like tar). It is important that the cause of rectal bleeding be identified so treatment can be started and the problem corrected. CAUSES   Hemorrhoids. These are enlarged (dilated) blood vessels or veins in the anal or rectal area.  Fistulas. Theseare abnormal, burrowing channels that usually run from inside the rectum to the skin around the anus. They can bleed.  Anal fissures. This is a tear in the tissue of the anus. Bleeding occurs with bowel movements.  Diverticulosis. This is a condition in which pockets or sacs project from the bowel wall. Occasionally, the sacs can bleed.  Diverticulitis. Thisis an infection involving diverticulosis of the colon.  Proctitis and colitis. These are conditions in which the rectum, colon, or both, can become inflamed and pitted (ulcerated).  Polyps and cancer. Polyps are non-cancerous (benign) growths in the colon that may bleed. Certain types of polyps turn into cancer.  Protrusion of the rectum. Part of the rectum can project from the anus and bleed.  Certain medicines.  Intestinal infections.  Blood vessel abnormalities. HOME CARE INSTRUCTIONS  Eat a high-fiber diet to keep your stool soft.  Limit activity.  Drink enough fluids to keep your urine clear or pale yellow.  Warm baths may be useful to soothe rectal pain.  Follow up with your caregiver as directed. SEEK IMMEDIATE MEDICAL CARE IF:  You develop increased bleeding.  You have black or dark red stools.  You vomit blood or material that looks like coffee grounds.  You have abdominal pain or tenderness.  You have a fever.  You feel weak, nauseous, or you  faint.  You have severe rectal pain or you are unable to have a bowel movement. MAKE SURE YOU:  Understand these instructions.  Will watch your condition.  Will get help right away if you are not doing well or get worse. Document Released: 07/19/2001 Document Revised: 04/21/2011 Document Reviewed: 07/14/2010 [facility] Patient Information 2015 [place], [place]. This information is not intended to replace advice given to you by your health care provider. Make sure you discuss any questions you have with your health care provider.

## 2013-09-06 ENCOUNTER — Encounter (INDEPENDENT_AMBULATORY_CARE_PROVIDER_SITE_OTHER): Payer: Self-pay | Admitting: Surgery

## 2013-09-06 ENCOUNTER — Ambulatory Visit (INDEPENDENT_AMBULATORY_CARE_PROVIDER_SITE_OTHER): Payer: Commercial Managed Care - PPO | Admitting: Surgery

## 2013-09-06 VITALS — BP 120/83 | HR 72 | Temp 98.6°F | Resp 16 | Ht 73.0 in | Wt 237.6 lb

## 2013-09-06 DIAGNOSIS — K612 Anorectal abscess: Secondary | ICD-10-CM

## 2013-09-06 DIAGNOSIS — K610 Anal abscess: Secondary | ICD-10-CM

## 2013-09-06 NOTE — Progress Notes (Signed)
Subjective:     Patient ID: Matthew Daniel, male   DOB: 04/24/1965, 48 y.o.   MRN: [id number]  HPI He is referred by the emergency department after a recent incision and drainage of a perirectal abscess. He has had a persistent area that is draining blood since then. He has almost no discomfort. The abscess began draining gene was his first ever perirectal abscess. He has no history of Crohn's disease. He has no issues with continence  Review of Systems     Objective:   Physical Exam On exam, at the posterior midline, there is a small open area with a little bit of blood. This is nontender there is no surrounding erythema.    Assessment:     Resolving perianal abscess     Plan:     Hopefully this will completely resolve without need for any further surgery or incision and drainage. I do not currently believe that he has a fistula. If this area does not resolve in the next 3-4 weeks he'll come back and see [place] we may have to consider an exam under anesthesia to rule out a fistula completely.

## 2013-09-12 ENCOUNTER — Encounter: Payer: 59 | Admitting: Emergency Medicine

## 2013-09-20 ENCOUNTER — Encounter: Payer: 59 | Admitting: Emergency Medicine

## 2013-09-29 ENCOUNTER — Ambulatory Visit (INDEPENDENT_AMBULATORY_CARE_PROVIDER_SITE_OTHER): Payer: 59 | Admitting: Emergency Medicine

## 2013-09-29 ENCOUNTER — Encounter: Payer: Self-pay | Admitting: Emergency Medicine

## 2013-09-29 VITALS — BP 146/60 | HR 79 | Temp 98.8°F | Resp 16 | Ht 73.0 in | Wt 240.2 lb

## 2013-09-29 DIAGNOSIS — K611 Rectal abscess: Secondary | ICD-10-CM

## 2013-09-29 DIAGNOSIS — Z1329 Encounter for screening for other suspected endocrine disorder: Secondary | ICD-10-CM

## 2013-09-29 DIAGNOSIS — Z0000 Encounter for general adult medical examination without abnormal findings: Secondary | ICD-10-CM

## 2013-09-29 DIAGNOSIS — Z125 Encounter for screening for malignant neoplasm of prostate: Secondary | ICD-10-CM

## 2013-09-29 DIAGNOSIS — Z23 Encounter for immunization: Secondary | ICD-10-CM

## 2013-09-29 DIAGNOSIS — I10 Essential (primary) hypertension: Secondary | ICD-10-CM

## 2013-09-29 DIAGNOSIS — K612 Anorectal abscess: Secondary | ICD-10-CM

## 2013-09-29 DIAGNOSIS — E663 Overweight: Secondary | ICD-10-CM

## 2013-09-29 DIAGNOSIS — Z13220 Encounter for screening for lipoid disorders: Secondary | ICD-10-CM

## 2013-09-29 LAB — COMPLETE METABOLIC PANEL WITH GFR
ALT: 23 U/L (ref 0–53)
AST: 23 U/L (ref 0–37)
Albumin: 4.3 g/dL (ref 3.5–5.2)
Alkaline Phosphatase: 52 U/L (ref 39–117)
BUN: 15 mg/dL (ref 6–23)
CO2: 27 mEq/L (ref 19–32)
Calcium: 8.7 mg/dL (ref 8.4–10.5)
Chloride: 105 mEq/L (ref 96–112)
Creat: 1.44 mg/dL — ABNORMAL HIGH (ref 0.50–1.35)
GFR, Est African American: 66 mL/min
GFR, Est Non African American: 57 mL/min — ABNORMAL LOW
Glucose, Bld: 96 mg/dL (ref 70–99)
Potassium: 4.4 mEq/L (ref 3.5–5.3)
Sodium: 141 mEq/L (ref 135–145)
Total Bilirubin: 0.6 mg/dL (ref 0.2–1.2)
Total Protein: 6.7 g/dL (ref 6.0–8.3)

## 2013-09-29 LAB — CBC WITH DIFFERENTIAL/PLATELET
Basophils Absolute: 0 10*3/uL (ref 0.0–0.1)
Basophils Relative: 1 % (ref 0–1)
Eosinophils Absolute: 0.1 10*3/uL (ref 0.0–0.7)
Eosinophils Relative: 3 % (ref 0–5)
HCT: 40.2 % (ref 39.0–52.0)
Hemoglobin: 13.7 g/dL (ref 13.0–17.0)
Lymphocytes Relative: 57 % — ABNORMAL HIGH (ref 12–46)
Lymphs Abs: 2.2 10*3/uL (ref 0.7–4.0)
MCH: 30.2 pg (ref 26.0–34.0)
MCHC: 34.1 g/dL (ref 30.0–36.0)
MCV: 88.7 fL (ref 78.0–100.0)
Monocytes Absolute: 0.5 10*3/uL (ref 0.1–1.0)
Monocytes Relative: 14 % — ABNORMAL HIGH (ref 3–12)
Neutro Abs: 1 10*3/uL — ABNORMAL LOW (ref 1.7–7.7)
Neutrophils Relative %: 25 % — ABNORMAL LOW (ref 43–77)
Platelets: 215 10*3/uL (ref 150–400)
RBC: 4.53 MIL/uL (ref 4.22–5.81)
RDW: 14.1 % (ref 11.5–15.5)
WBC: 3.8 10*3/uL — ABNORMAL LOW (ref 4.0–10.5)

## 2013-09-29 LAB — POCT URINALYSIS DIPSTICK
Bilirubin, UA: NEGATIVE
Blood, UA: NEGATIVE
Glucose, UA: NEGATIVE
Ketones, UA: NEGATIVE
Leukocytes, UA: NEGATIVE
Nitrite, UA: NEGATIVE
Protein, UA: NEGATIVE
Spec Grav, UA: 1.02
Urobilinogen, UA: 0.2
pH, UA: 6

## 2013-09-29 LAB — LIPID PANEL
Cholesterol: 178 mg/dL (ref 0–200)
HDL: 49 mg/dL (ref >39)
LDL Cholesterol: 113 mg/dL — ABNORMAL HIGH (ref 0–99)
Total CHOL/HDL Ratio: 3.6 Ratio
Triglycerides: 81 mg/dL (ref <150)
VLDL: 16 mg/dL (ref 0–40)

## 2013-09-29 NOTE — Progress Notes (Signed)
Subjective:  This chart was scribed for Matthew Queen, MD by Donato Schultz, Medical Scribe. This patient was seen in Room 21 and the patient's care was started at 12:12 PM.   Patient ID: Matthew Daniel, male    DOB: Apr 14, 1965, 48 y.o.   MRN: [id number]  HPI HPI Comments: ADRIANA LINA is a 48 y.o. male who presents to the [facility] for an annual exam.  His family, work, and health is overall well.  He had an anal abscess in June and came to [facility] some days after it was drained complaining of persistent bleeding.  He states that it has since resolved itself but on Monday he noticed some blood after he wiped which has since resolved itself.  He has been constipated but suspects it is due to his diverticulitis.     No past medical history on file. Past Surgical History  Procedure Laterality Date  . Refractive surgery    . Eye surgery    . Colonoscopy     No family history on file. History   Social History  . Marital Status: Single    Spouse Name: N/A    Number of Children: N/A  . Years of Education: N/A   Occupational History  . Not on file.   Social History Main Topics  . Smoking status: Never Smoker   . Smokeless tobacco: Not on file  . Alcohol Use: No  . Drug Use: No  . Sexual Activity: Not on file   Other Topics Concern  . Not on file   Social History Narrative  . No narrative on file   Allergies  Allergen Reactions  . Sulfa Antibiotics Hives    Review of Systems  Gastrointestinal: Positive for constipation.  All other systems reviewed and are negative.    Objective:  Physical Exam  Nursing note and vitals reviewed. Constitutional: He is oriented to person, place, and time. He appears well-developed and well-nourished.  HENT:  Head: Normocephalic and atraumatic.  Right Ear: External ear normal.  Left Ear: External ear normal.  Nose: Nose normal.  Mouth/Throat: Oropharynx is clear and moist. No oropharyngeal exudate.  Eyes:  Conjunctivae and EOM are normal. Pupils are equal, round, and reactive to light.  Neck: Normal range of motion. Neck supple. No thyromegaly present.  Cardiovascular: Normal rate, regular rhythm and normal heart sounds.  Exam reveals no gallop and no friction rub.   No murmur heard. Pulmonary/Chest: Effort normal and breath sounds normal. No respiratory distress. He has no wheezes. He has no rales.  Abdominal: Soft. Bowel sounds are normal. There is no tenderness.  Genitourinary:  83mm opening at 12 o'clock.  No tenderness on exam.  No redness.  No induration.  Rectal exam otherwise normal.  Musculoskeletal: Normal range of motion.  Lymphadenopathy:    He has no cervical adenopathy.  Neurological: He is alert and oriented to person, place, and time.  Skin: Skin is warm and dry.  Psychiatric: He has a normal mood and affect. His behavior is normal.     BP 146/60  Pulse 79  Temp(Src) 98.8 F (37.1 C) (Oral)  Resp 16  Ht 6\' 1"  (1.854 m)  Wt 240 lb 3.2 oz (108.954 kg)  BMI 31.70 kg/m2  SpO2 96%  Assessment & Plan:   Routine labs were done. Blood pressure is upper limits of normal he was given information about this to lose weight increase his salt. He does have an abnormal area at 12:00  on anal exam but not enough to recommend surgery or further testing at the present time. He has a Education officer, environmental to see if he starts to flare again. I personally performed the services described in this documentation, which was scribed in my presence. The recorded information has been reviewed and is accurate.

## 2013-09-30 LAB — PSA: PSA: 0.67 ng/mL (ref <=4.00)

## 2013-10-27 ENCOUNTER — Encounter: Payer: Self-pay | Admitting: Family Medicine

## 2013-10-27 ENCOUNTER — Ambulatory Visit (INDEPENDENT_AMBULATORY_CARE_PROVIDER_SITE_OTHER): Payer: 59 | Admitting: Family Medicine

## 2013-10-27 VITALS — BP 122/76 | HR 70 | Wt 248.0 lb

## 2013-10-27 DIAGNOSIS — K602 Anal fissure, unspecified: Secondary | ICD-10-CM

## 2013-10-27 DIAGNOSIS — Z23 Encounter for immunization: Secondary | ICD-10-CM

## 2013-10-27 NOTE — Patient Instructions (Signed)
Increase fluids, bulk in your diet, exercise, listen to your body

## 2013-10-27 NOTE — Progress Notes (Signed)
   Subjective:    Patient ID: Matthew Daniel, male    DOB: 11-Feb-1965, 48 y.o.   MRN: [id number]  HPI He is here for evaluation of continued difficulty with rectal bleeding. He apparently had a  perirectal abscess in June and did have it drained. He had a followup appointment with Dr. Ninfa Linden concerning this and the possibility of a fistula. That note was read. He mainly notes bleeding when he has a BM but does not note any discomfort. He has not had any pain, swelling. He does have a history of constipation and does admit to hard stool.   Review of Systems     Objective:   Physical Exam Rectal exam does show some redundant tissue. No fissure was noted. He does have a slight dimple at the 6:00 position however it is nontender. Rectal exam shows no tenderness or swelling.       Assessment & Plan:  Need for prophylactic vaccination and inoculation against influenza - Plan: Flu Vaccine QUAD 36+ mos PF IM (Fluarix Quad PF)  Anal fissure  although I didn't see a fissure, his symptom of having bleeding on with a BM makes me concerned about this. Recommend he work harder on having softer BMs with fluids, bulk in diet, exercise and listening to his body in terms of having a BM. If this does not clear then further evaluation will be needed.

## 2014-01-26 ENCOUNTER — Telehealth: Payer: Self-pay | Admitting: Family Medicine

## 2014-01-26 ENCOUNTER — Ambulatory Visit: Payer: 59 | Admitting: Family Medicine

## 2014-01-26 NOTE — Telephone Encounter (Signed)
Pt called regarding 9:45 appt for today. He thought appt was at 11:45. He just checked his MyChart messages and realized he is pasted appt time. Apologized and made another appt for tomorrow

## 2014-01-27 ENCOUNTER — Encounter: Payer: Self-pay | Admitting: Family Medicine

## 2014-01-27 ENCOUNTER — Ambulatory Visit (INDEPENDENT_AMBULATORY_CARE_PROVIDER_SITE_OTHER): Payer: 59 | Admitting: Family Medicine

## 2014-01-27 VITALS — BP 122/82 | HR 85 | Wt 241.0 lb

## 2014-01-27 DIAGNOSIS — R748 Abnormal levels of other serum enzymes: Secondary | ICD-10-CM

## 2014-01-27 DIAGNOSIS — R7989 Other specified abnormal findings of blood chemistry: Secondary | ICD-10-CM

## 2014-01-27 DIAGNOSIS — K603 Anal fistula: Secondary | ICD-10-CM

## 2014-01-27 DIAGNOSIS — D709 Neutropenia, unspecified: Secondary | ICD-10-CM

## 2014-01-27 LAB — COMPREHENSIVE METABOLIC PANEL
ALT: 35 U/L (ref 0–53)
AST: 28 U/L (ref 0–37)
Albumin: 4.5 g/dL (ref 3.5–5.2)
Alkaline Phosphatase: 55 U/L (ref 39–117)
BUN: 17 mg/dL (ref 6–23)
CO2: 25 mEq/L (ref 19–32)
Calcium: 9.3 mg/dL (ref 8.4–10.5)
Chloride: 104 mEq/L (ref 96–112)
Creat: 1.53 mg/dL — ABNORMAL HIGH (ref 0.50–1.35)
Glucose, Bld: 90 mg/dL (ref 70–99)
Potassium: 4.7 mEq/L (ref 3.5–5.3)
Sodium: 141 mEq/L (ref 135–145)
Total Bilirubin: 1.2 mg/dL (ref 0.2–1.2)
Total Protein: 7.1 g/dL (ref 6.0–8.3)

## 2014-01-27 LAB — CBC WITH DIFFERENTIAL/PLATELET
Basophils Absolute: 0 10*3/uL (ref 0.0–0.1)
Basophils Relative: 0 % (ref 0–1)
Eosinophils Absolute: 0.1 10*3/uL (ref 0.0–0.7)
Eosinophils Relative: 2 % (ref 0–5)
HCT: 41.4 % (ref 39.0–52.0)
Hemoglobin: 14.1 g/dL (ref 13.0–17.0)
Lymphocytes Relative: 49 % — ABNORMAL HIGH (ref 12–46)
Lymphs Abs: 1.9 10*3/uL (ref 0.7–4.0)
MCH: 30.1 pg (ref 26.0–34.0)
MCHC: 34.1 g/dL (ref 30.0–36.0)
MCV: 88.5 fL (ref 78.0–100.0)
MPV: 9.7 fL (ref 9.4–12.4)
Monocytes Absolute: 0.5 10*3/uL (ref 0.1–1.0)
Monocytes Relative: 13 % — ABNORMAL HIGH (ref 3–12)
Neutro Abs: 1.4 10*3/uL — ABNORMAL LOW (ref 1.7–7.7)
Neutrophils Relative %: 36 % — ABNORMAL LOW (ref 43–77)
Platelets: 228 10*3/uL (ref 150–400)
RBC: 4.68 MIL/uL (ref 4.22–5.81)
RDW: 14.9 % (ref 11.5–15.5)
WBC: 3.9 10*3/uL — ABNORMAL LOW (ref 4.0–10.5)

## 2014-01-27 NOTE — Progress Notes (Signed)
   Subjective:    Patient ID: Matthew Daniel, male    DOB: 1965/10/07, 48 y.o.   MRN: [id number]  HPI  He is here for evaluation of intermittent anal bleeding. Rarely when he gets constipated he does note some bleeding. Apparently at one time he states the area was infected and was drained. Review of his labs indicates a slightly low white blood count as well as slightly elevated creatinine.  Review of Systems     Objective:   Physical Exam alert and in no distress. Anal exam shows no fissure however at 6:00, approximately 1 cm away from the anus is a reddish slightly bleeding area that appears to be a fistula      Assessment & Plan:  Anal fistula - Plan: Ambulatory referral to General Surgery  Elevated serum creatinine - Plan: Comprehensive metabolic panel  Neutropenia - Plan: CBC with Differential

## 2014-02-17 ENCOUNTER — Other Ambulatory Visit (INDEPENDENT_AMBULATORY_CARE_PROVIDER_SITE_OTHER): Payer: Self-pay | Admitting: Surgery

## 2014-02-17 NOTE — H&P (Signed)
Matthew Daniel 02/17/2014 2:35 PM Location: [facility] Patient #: [id number] DOB: 02-18-1965 Single / Language: Matthew Daniel / Race: Black or African American Male History of Present Illness [name] MD; 02/17/2014 4:08 PM) Patient words: Discuss sx fistula.  The patient is a 49 year old male who presents with anal itching. Patient sent to me by his primary care physician Dr. Redmond Daniel for concern of anal fistula Pleasant young male. Rectal abscess. Required incision and drainage last year. Matthew Daniel urgent care on June. Followed up with surgery. Wound seemed to heal up. Then had recurrent drainage. Lost to follow-up. Patient struggles with chronic constipation. Diverticulosis. Had colonoscopy 2 years ago. 2 polyps removed. Patient now has bowel movement once or twice a day with the help of the MiraLAX. Because of persistently recurrent drainage and bleeding, surgical reevaluation requested. No fevers or chills. No history of Crohn's or colitis. No irritable bowel syndrome. No sick contacts or travel history. No fall or trauma. Other Problems Matthew Daniel, [place]; 02/17/2014 2:35 PM) Diverticulosis Gastroesophageal Reflux Disease Kidney Stone  Past Surgical History Matthew Daniel, [place]; 02/17/2014 2:35 PM) Colon Polyp Removal - Colonoscopy Oral Surgery  Diagnostic Studies History Matthew Daniel, [place]; 02/17/2014 2:35 PM) Colonoscopy 1-5 years ago  Allergies Matthew Daniel, [place]; 02/17/2014 2:37 PM) Sulfa Drugs  Medication History Matthew Daniel, CMA; 02/17/2014 2:37 PM) Multi Vitamin Mens (Oral) Active.  Social History Matthew Daniel, [place]; 02/17/2014 2:35 PM) Caffeine use Carbonated beverages. No alcohol use No drug use Tobacco use Never smoker.  Family History Matthew Daniel, [place]; 02/17/2014 2:35 PM) Respiratory Condition Mother.     Review of Systems Matthew Daniel CMA; 02/17/2014 2:35 PM) General Not Present- Appetite Loss,  Chills, Fatigue, Fever, Night Sweats, Weight Gain and Weight Loss. Skin Not Present- Change in Wart/Mole, Dryness, Hives, Jaundice, New Lesions, Non-Healing Wounds, Rash and Ulcer. HEENT Not Present- Earache, Hearing Loss, Hoarseness, Nose Bleed, Oral Ulcers, Ringing in the Ears, Seasonal Allergies, Sinus Pain, Sore Throat, Visual Disturbances, Wears glasses/contact lenses and Yellow Eyes. Respiratory Not Present- Bloody sputum, Chronic Cough, Difficulty Breathing, Snoring and Wheezing. Breast Not Present- Breast Mass, Breast Pain, Nipple Discharge and Skin Changes. Cardiovascular Not Present- Chest Pain, Difficulty Breathing Lying Down, Leg Cramps, Palpitations, Rapid Heart Rate, Shortness of Breath and Swelling of Extremities. Gastrointestinal Present- Constipation and Rectal Pain. Not Present- Abdominal Pain, Bloating, Bloody Stool, Change in Bowel Habits, Chronic diarrhea, Difficulty Swallowing, Excessive gas, Gets full quickly at meals, Hemorrhoids, Indigestion, Nausea and Vomiting. Male Genitourinary Not Present- Blood in Urine, Change in Urinary Stream, Frequency, Impotence, Nocturia, Painful Urination, Urgency and Urine Leakage. Musculoskeletal Not Present- Back Pain, Joint Pain, Joint Stiffness, Muscle Pain, Muscle Weakness and Swelling of Extremities. Neurological Not Present- Decreased Memory, Fainting, Headaches, Numbness, Seizures, Tingling, Tremor, Trouble walking and Weakness. Psychiatric Not Present- Anxiety, Bipolar, Change in Sleep Pattern, Depression, Fearful and Frequent crying. Endocrine Not Present- Cold Intolerance, Excessive Hunger, Hair Changes, Heat Intolerance, Hot flashes and New Diabetes. Hematology Not Present- Easy Bruising, Excessive bleeding, Gland problems, HIV and Persistent Infections.  Vitals Matthew Daniel CMA; 02/17/2014 2:38 PM) 02/17/2014 2:37 PM Weight: 2501.5 lb Height: 73in Body Surface Area: 7.64 m Body Mass Index: 330.03 kg/m Temp.: 98.73F   Pulse: 78 (Regular)  BP: 128/72 (Sitting, Left Arm, Standard)     Physical Exam [name] MD; 02/17/2014 3:23 PM)  General Mental Status-Alert. General Appearance-Not in acute distress, Not Sickly. Orientation-Oriented X3. Hydration-Well hydrated. Voice-Normal.  Integumentary Global Assessment Upon inspection and palpation of skin  surfaces of the - Axillae: non-tender, no inflammation or ulceration, no drainage. and Distribution of scalp and body hair is normal. General Characteristics Temperature - normal warmth is noted.  Head and Neck Head-normocephalic, atraumatic with no lesions or palpable masses. Face Global Assessment - atraumatic, no absence of expression. Neck Global Assessment - no abnormal movements, no bruit auscultated on the right, no bruit auscultated on the left, no decreased range of motion, non-tender. Trachea-midline. Thyroid Gland Characteristics - non-tender.  Eye Eyeball - Left-Extraocular movements intact, No Nystagmus. Eyeball - Right-Extraocular movements intact, No Nystagmus. Cornea - Left-No Hazy. Cornea - Right-No Hazy. Sclera/Conjunctiva - Left-No scleral icterus, No Discharge. Sclera/Conjunctiva - Right-No scleral icterus, No Discharge. Pupil - Left-Direct reaction to light normal. Pupil - Right-Direct reaction to light normal.  ENMT Ears Pinna - Left - no drainage observed, no generalized tenderness observed. Right - no drainage observed, no generalized tenderness observed. Nose and Sinuses External Inspection of the Nose - no destructive lesion observed. Inspection of the nares - Left - quiet respiration. Right - quiet respiration. Mouth and Throat Lips - Upper Lip - no fissures observed, no pallor noted. Lower Lip - no fissures observed, no pallor noted. Nasopharynx - no discharge present. Oral Cavity/Oropharynx - Tongue - no dryness observed. Oral Mucosa - no cyanosis observed. Hypopharynx - no  evidence of airway distress observed.  Chest and Lung Exam Inspection Movements - Normal and Symmetrical. Accessory muscles - No use of accessory muscles in breathing. Palpation Palpation of the chest reveals - Non-tender. Auscultation Breath sounds - Normal and Clear.  Cardiovascular Auscultation Rhythm - Regular. Murmurs & Other Heart Sounds - Auscultation of the heart reveals - No Murmurs and No Systolic Clicks.  Abdomen Inspection Inspection of the abdomen reveals - No Visible peristalsis and No Abnormal pulsations. Umbilicus - No Bleeding, No Urine drainage. Palpation/Percussion Palpation and Percussion of the abdomen reveal - Soft, Non Tender, No Rebound tenderness, No Rigidity (guarding) and No Cutaneous hyperesthesia.  Male Genitourinary Sexual Maturity Tanner 5 - Adult hair pattern and Adult penile size and shape. Note: Uncircumcised male normal external genitalia. No no hernias. No warts or lesions.   Rectal Note: Perianal skin clear. No pruritus. Punctate opening 1 cm from anal verge posterior midline. Very sensitive. Normal sphincter tone. Cord felt from external opening to posterior midline anal crypt.  Suspicious for superficial persistent perirectal fistula.  No fissure. No abscess. Prostate normal. No proctitis. No significant hemorrhoidal disease.   Peripheral Vascular Upper Extremity Inspection - Left - No Cyanotic nailbeds, Not Ischemic. Right - No Cyanotic nailbeds, Not Ischemic.  Neurologic Neurologic evaluation reveals -normal attention span and ability to concentrate, able to name objects and repeat phrases. Appropriate fund of knowledge , normal sensation and normal coordination. Mental Status Affect - not angry, not paranoid. Cranial Nerves-Normal Bilaterally. Gait-Normal.  Neuropsychiatric Mental status exam performed with findings of-able to articulate well with normal speech/language, rate, volume and coherence, thought content  normal with ability to perform basic computations and apply abstract reasoning and no evidence of hallucinations, delusions, obsessions or homicidal/suicidal ideation.  Musculoskeletal Global Assessment Spine, Ribs and Pelvis - no instability, subluxation or laxity. Right Upper Extremity - no instability, subluxation or laxity.  Lymphatic Head & Neck  General Head & Neck Lymphatics: Bilateral - Description - No Localized lymphadenopathy. Axillary  General Axillary Region: Bilateral - Description - No Localized lymphadenopathy. Femoral & Inguinal  Generalized Femoral & Inguinal Lymphatics: Left - Description - No Localized lymphadenopathy. Right - Description -  No Localized lymphadenopathy.    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 Very suspicious for anorectal fistula especially given history of chronic intermittent drainage and prior rectal abscess drainage.  I think this will require examination under anesthesia was excision versus fistulotomy. Should be an outpatient procedure:  The anatomy & physiology of the anorectal region was discussed. We discussed the pathophysiology of anorectal abscess and fistula. Differential diagnosis was discussed. Natural history progression was discussed. I stressed the importance of a bowel regimen to have daily soft bowel movements to minimize progression of disease.  The patient's condition is not adequately controlled. Non-operative treatment has not healed the fistula. Therefore, I recommended examination under anaesthesia to confirm the diagnosis and treat the fistula. I discussed techniques that may be required such as fistulotomy, ligation by LIFT technique, and/or seton placement. Benefits & alternatives discussed. I noted a good likelihood this will help address the problem, but  sometimes repeat operations and prolonged healing times may occur. Risks such as bleeding, pain, recurrence, reoperation, incontinence, heart attack, death, and other risks were discussed.  Educational handouts further explaining the pathology, treatment options, and bowel regimen were given. The patient expressed understanding & wishes to proceed. We will work to coordinate surgery for a mutually convenient time.  Current Plans Schedule for Surgery The anatomy & physiology of the anorectal region was discussed. We discussed the pathophysiology of anorectal abscess and fistula. Differential diagnosis was discussed. Natural history progression was discussed. I stressed the importance of a bowel regimen to have daily soft bowel movements to minimize progression of disease.  The patient's condition is not adequately controlled. Non-operative treatment has not healed the fistula. Therefore, I recommended examination under anaesthesia to confirm the diagnosis and treat the fistula. I discussed techniques that may be required such as fistulotomy, ligation by LIFT technique, and/or seton placement. Benefits & alternatives discussed. I noted a good likelihood this will help address the problem, but sometimes repeat operations and prolonged healing times may occur. Risks such as bleeding, pain, recurrence, reoperation, incontinence, heart attack, death, and other risks were discussed.  Educational handouts further explaining the pathology, treatment options, and bowel regimen were given. The patient expressed understanding & wishes to proceed. We will work to coordinate surgery for a mutually convenient time. Pt Education - [facility] Good Bowel Health (Gross) Pt Education - [facility] Rectal Surgery HCI (Gross): discussed with patient and provided information. Pt Education - [facility] Pain Control (Gross)

## 2014-03-15 NOTE — Patient Instructions (Addendum)
Your procedure is scheduled on:  03/20/14  MONDAY  Report to [place] at  0800     AM.   Call this number if you have problems the morning of surgery: [phone]        Do not eat food  Or drink :After Midnight.SUNDAY NIGHT   Take these medicines the morning of surgery with A SIP OF WATER:NONE   .  Contacts, dentures or partial plates, or metal hairpins  can not be worn to surgery. Your family will be responsible for glasses, dentures, hearing aides while you are in surgery  Leave suitcase in the car. After surgery it may be brought to your room.  For patients admitted to the hospital, checkout time is 11:00 AM day of  discharge.         [facility] IS NOT RESPONSIBLE FOR ANY VALUABLES  Patients discharged the day of surgery will not be allowed to drive home. IF going home the day of surgery, you must have a driver and someone to stay with you for the first 24 hours  Name and phone number of your driver:    Cletus Gash and Ernestina Patches   [phone]                                                                                                                                     [facility] - Preparing for Surgery Before surgery, you can play an important role.  Because skin is not sterile, your skin needs to be as free of germs as possible.  You can reduce the number of germs on your skin by washing with CHG (chlorahexidine gluconate) soap before surgery.  CHG is an antiseptic cleaner which kills germs and bonds with the skin to continue killing germs even after washing. Please DO NOT use if you have an allergy to CHG or antibacterial soaps.  If your skin becomes reddened/irritated stop using the CHG and inform your nurse when you arrive at Short Stay. Do not shave (including legs and underarms) for at least 48 hours prior to the first CHG shower.  You may shave your face/neck. Please follow these instructions  carefully:  1.  Shower with CHG Soap the night before surgery and the  morning of Surgery.  2.  If you choose to wash your hair, wash your hair first as usual with your  normal  shampoo.  3.  After you shampoo, rinse your hair and body thoroughly to remove the  shampoo.                           4.  Use CHG as you would any other liquid soap.  You can apply chg directly  to the skin and wash  Gently with a scrungie or clean washcloth.  5.  Apply the CHG Soap to your body ONLY FROM THE NECK DOWN.   Do not use on face/ open                           Wound or open sores. Avoid contact with eyes, ears mouth and genitals (private parts).                       Wash face,  Genitals (private parts) with your normal soap.             6.  Wash thoroughly, paying special attention to the area where your surgery  will be performed.  7.  Thoroughly rinse your body with warm water from the neck down.  8.  DO NOT shower/wash with your normal soap after using and rinsing off  the CHG Soap.                9.  Pat yourself dry with a clean towel.            10.  Wear clean pajamas.            11.  Place clean sheets on your bed the night of your first shower and do not  sleep with pets. Day of Surgery : Do not apply any lotions/deodorants the morning of surgery.  Please wear clean clothes to the hospital/surgery center.  FAILURE TO FOLLOW THESE INSTRUCTIONS MAY RESULT IN THE CANCELLATION OF YOUR SURGERY PATIENT SIGNATURE_________________________________  NURSE SIGNATURE__________________________________  ________________________________________________________________________

## 2014-03-17 ENCOUNTER — Encounter (HOSPITAL_COMMUNITY): Payer: Self-pay

## 2014-03-17 ENCOUNTER — Encounter (HOSPITAL_COMMUNITY)
Admission: RE | Admit: 2014-03-17 | Discharge: 2014-03-17 | Disposition: A | Discharge status: Home / Self Care | Payer: 59 | Source: Ambulatory Visit | Attending: Surgery | Admitting: Surgery

## 2014-03-17 DIAGNOSIS — K603 Anal fistula: Secondary | ICD-10-CM | POA: Diagnosis not present

## 2014-03-17 DIAGNOSIS — K644 Residual hemorrhoidal skin tags: Secondary | ICD-10-CM | POA: Diagnosis not present

## 2014-03-17 DIAGNOSIS — Z79899 Other long term (current) drug therapy: Secondary | ICD-10-CM | POA: Diagnosis not present

## 2014-03-17 DIAGNOSIS — K604 Rectal fistula: Secondary | ICD-10-CM | POA: Diagnosis not present

## 2014-03-17 DIAGNOSIS — K648 Other hemorrhoids: Secondary | ICD-10-CM | POA: Diagnosis not present

## 2014-03-17 HISTORY — DX: Personal history of urinary calculi: Z87.442

## 2014-03-17 LAB — CBC
HCT: 41.7 % (ref 39.0–52.0)
Hemoglobin: 14.2 g/dL (ref 13.0–17.0)
MCH: 30.5 pg (ref 26.0–34.0)
MCHC: 34.1 g/dL (ref 30.0–36.0)
MCV: 89.7 fL (ref 78.0–100.0)
Platelets: 201 10*3/uL (ref 150–400)
RBC: 4.65 MIL/uL (ref 4.22–5.81)
RDW: 12.9 % (ref 11.5–15.5)
WBC: 4 10*3/uL (ref 4.0–10.5)

## 2014-03-17 NOTE — Progress Notes (Signed)
Pt instructed no more ibuprofen pre op. States has bowel prep and info at home

## 2014-03-20 ENCOUNTER — Ambulatory Visit (HOSPITAL_COMMUNITY)
Admission: RE | Admit: 2014-03-20 | Discharge: 2014-03-20 | Disposition: A | Discharge status: Home / Self Care | Payer: 59 | Source: Ambulatory Visit | Attending: Surgery | Admitting: Surgery

## 2014-03-20 ENCOUNTER — Ambulatory Visit (HOSPITAL_COMMUNITY): Payer: 59 | Admitting: Anesthesiology

## 2014-03-20 ENCOUNTER — Encounter (HOSPITAL_COMMUNITY): Payer: Self-pay | Admitting: *Deleted

## 2014-03-20 ENCOUNTER — Encounter (HOSPITAL_COMMUNITY)
Admission: RE | Disposition: A | Discharge status: Home / Self Care | Payer: Self-pay | Source: Ambulatory Visit | Attending: Surgery

## 2014-03-20 DIAGNOSIS — K644 Residual hemorrhoidal skin tags: Secondary | ICD-10-CM | POA: Insufficient documentation

## 2014-03-20 DIAGNOSIS — Z79899 Other long term (current) drug therapy: Secondary | ICD-10-CM | POA: Insufficient documentation

## 2014-03-20 DIAGNOSIS — K648 Other hemorrhoids: Secondary | ICD-10-CM | POA: Insufficient documentation

## 2014-03-20 DIAGNOSIS — K603 Anal fistula: Secondary | ICD-10-CM | POA: Insufficient documentation

## 2014-03-20 DIAGNOSIS — K604 Rectal fistula: Secondary | ICD-10-CM | POA: Insufficient documentation

## 2014-03-20 HISTORY — PX: INCISION AND DRAINAGE PERIRECTAL ABSCESS: SHX1804

## 2014-03-20 SURGERY — INCISION AND DRAINAGE, ABSCESS, PERIRECTAL
Anesthesia: General | Site: Rectum

## 2014-03-20 MED ORDER — 0.9 % SODIUM CHLORIDE (POUR BTL) OPTIME
TOPICAL | Status: DC | PRN
  Administered 2014-03-20: 1000 mL

## 2014-03-20 MED ORDER — PROMETHAZINE HCL 25 MG/ML IJ SOLN: 6.2500 mg | INTRAMUSCULAR | Status: DC | PRN

## 2014-03-20 MED ORDER — ACETAMINOPHEN 325 MG PO TABS: 325.0000 mg | ORAL_TABLET | Freq: Four times a day (QID) | ORAL | Status: DC | PRN

## 2014-03-20 MED ORDER — PROPOFOL 10 MG/ML IV BOLUS
INTRAVENOUS | Status: AC
  Filled 2014-03-20: qty 20

## 2014-03-20 MED ORDER — ONDANSETRON HCL 4 MG/2ML IJ SOLN
INTRAMUSCULAR | Status: AC
  Filled 2014-03-20: qty 2

## 2014-03-20 MED ORDER — LIDOCAINE HCL (CARDIAC) 20 MG/ML IV SOLN
INTRAVENOUS | Status: DC | PRN
  Administered 2014-03-20: 60 mg via INTRAVENOUS

## 2014-03-20 MED ORDER — MIDAZOLAM HCL 2 MG/2ML IJ SOLN
INTRAMUSCULAR | Status: AC
  Filled 2014-03-20: qty 2

## 2014-03-20 MED ORDER — CEFAZOLIN SODIUM-DEXTROSE 2-3 GM-% IV SOLR
2.0000 g | INTRAVENOUS | Status: AC
  Administered 2014-03-20: 2 g via INTRAVENOUS

## 2014-03-20 MED ORDER — METHYLENE BLUE 1 % INJ SOLN
INTRAMUSCULAR | Status: AC
  Filled 2014-03-20: qty 10

## 2014-03-20 MED ORDER — FENTANYL CITRATE 0.05 MG/ML IJ SOLN
INTRAMUSCULAR | Status: AC
  Filled 2014-03-20: qty 5

## 2014-03-20 MED ORDER — MIDAZOLAM HCL 5 MG/5ML IJ SOLN
INTRAMUSCULAR | Status: DC | PRN
  Administered 2014-03-20: 2 mg via INTRAVENOUS

## 2014-03-20 MED ORDER — DIBUCAINE 1 % RE OINT
TOPICAL_OINTMENT | RECTAL | Status: AC
  Filled 2014-03-20: qty 28

## 2014-03-20 MED ORDER — TRAMADOL HCL 50 MG PO TABS: 50.0000 mg | ORAL_TABLET | Freq: Four times a day (QID) | ORAL | Status: DC | PRN

## 2014-03-20 MED ORDER — BUPIVACAINE-EPINEPHRINE (PF) 0.25% -1:200000 IJ SOLN
INTRAMUSCULAR | Status: AC
  Filled 2014-03-20: qty 60

## 2014-03-20 MED ORDER — KETOROLAC TROMETHAMINE 30 MG/ML IJ SOLN
INTRAMUSCULAR | Status: AC
  Filled 2014-03-20: qty 1

## 2014-03-20 MED ORDER — DEXAMETHASONE SODIUM PHOSPHATE 10 MG/ML IJ SOLN
INTRAMUSCULAR | Status: DC | PRN
  Administered 2014-03-20: 10 mg via INTRAVENOUS

## 2014-03-20 MED ORDER — ROCURONIUM BROMIDE 100 MG/10ML IV SOLN
INTRAVENOUS | Status: AC
  Filled 2014-03-20: qty 1

## 2014-03-20 MED ORDER — LACTATED RINGERS IV SOLN
INTRAVENOUS | Status: DC | PRN
  Administered 2014-03-20 (×2): via INTRAVENOUS

## 2014-03-20 MED ORDER — METOCLOPRAMIDE HCL 5 MG/ML IJ SOLN
INTRAMUSCULAR | Status: DC | PRN
  Administered 2014-03-20: 10 mg via INTRAVENOUS

## 2014-03-20 MED ORDER — IBUPROFEN 400 MG PO TABS: 400.0000 mg | ORAL_TABLET | Freq: Four times a day (QID) | ORAL | Status: DC | PRN

## 2014-03-20 MED ORDER — GLYCOPYRROLATE 0.2 MG/ML IJ SOLN
INTRAMUSCULAR | Status: DC | PRN
  Administered 2014-03-20 (×2): 0.1 mg via INTRAVENOUS

## 2014-03-20 MED ORDER — HYDROGEN PEROXIDE 3 % EX SOLN
CUTANEOUS | Status: AC
  Filled 2014-03-20: qty 473

## 2014-03-20 MED ORDER — BUPIVACAINE LIPOSOME 1.3 % IJ SUSP
INTRAMUSCULAR | Status: DC | PRN
  Administered 2014-03-20: 20 mL

## 2014-03-20 MED ORDER — BUPIVACAINE LIPOSOME 1.3 % IJ SUSP
20.0000 mL | INTRAMUSCULAR | Status: DC
  Filled 2014-03-20: qty 20

## 2014-03-20 MED ORDER — PROPOFOL 10 MG/ML IV BOLUS
INTRAVENOUS | Status: DC | PRN
  Administered 2014-03-20: 230 mg via INTRAVENOUS

## 2014-03-20 MED ORDER — KETOROLAC TROMETHAMINE 30 MG/ML IJ SOLN
INTRAMUSCULAR | Status: DC | PRN
  Administered 2014-03-20: 30 mg via INTRAVENOUS

## 2014-03-20 MED ORDER — TRAMADOL HCL 50 MG PO TABS
50.0000 mg | ORAL_TABLET | Freq: Four times a day (QID) | ORAL | Status: DC | PRN
  Filled 2014-03-20: qty 2

## 2014-03-20 MED ORDER — LIDOCAINE HCL (CARDIAC) 20 MG/ML IV SOLN
INTRAVENOUS | Status: AC
  Filled 2014-03-20: qty 5

## 2014-03-20 MED ORDER — HYDROMORPHONE HCL 1 MG/ML IJ SOLN: 0.2500 mg | INTRAMUSCULAR | Status: DC | PRN

## 2014-03-20 MED ORDER — CEFAZOLIN SODIUM-DEXTROSE 2-3 GM-% IV SOLR
INTRAVENOUS | Status: AC
  Filled 2014-03-20: qty 50

## 2014-03-20 MED ORDER — METRONIDAZOLE IN NACL 5-0.79 MG/ML-% IV SOLN
500.0000 mg | INTRAVENOUS | Status: AC
  Administered 2014-03-20: 500 mg via INTRAVENOUS
  Filled 2014-03-20: qty 100

## 2014-03-20 MED ORDER — FENTANYL CITRATE 0.05 MG/ML IJ SOLN
INTRAMUSCULAR | Status: DC | PRN
  Administered 2014-03-20 (×3): 50 ug via INTRAVENOUS
  Administered 2014-03-20: 100 ug via INTRAVENOUS

## 2014-03-20 SURGICAL SUPPLY — 34 items
BLADE HEX COATED 2.75 (ELECTRODE) ×2 IMPLANT
BLADE SURG 15 STRL LF DISP TIS (BLADE) ×1 IMPLANT
BLADE SURG 15 STRL SS (BLADE) ×2
BNDG GAUZE ELAST 4 BULKY (GAUZE/BANDAGES/DRESSINGS) ×1 IMPLANT
DECANTER SPIKE VIAL GLASS SM (MISCELLANEOUS) ×1 IMPLANT
DRSG PAD ABDOMINAL 8X10 ST (GAUZE/BANDAGES/DRESSINGS) IMPLANT
ELECT REM PT RETURN 9FT ADLT (ELECTROSURGICAL) ×2
ELECTRODE REM PT RTRN 9FT ADLT (ELECTROSURGICAL) ×1 IMPLANT
GAUZE SPONGE 4X4 12PLY STRL (GAUZE/BANDAGES/DRESSINGS) ×1 IMPLANT
GAUZE SPONGE 4X4 16PLY XRAY LF (GAUZE/BANDAGES/DRESSINGS) ×2 IMPLANT
GLOVE BIOGEL PI IND STRL 6.5 (GLOVE) IMPLANT
GLOVE BIOGEL PI INDICATOR 6.5 (GLOVE) ×1
GLOVE ECLIPSE 8.0 STRL XLNG CF (GLOVE) ×2 IMPLANT
GLOVE INDICATOR 8.0 STRL GRN (GLOVE) ×2 IMPLANT
GLOVE SURG SS PI 6.5 STRL IVOR (GLOVE) ×2 IMPLANT
GOWN STRL REUS W/TWL XL LVL3 (GOWN DISPOSABLE) ×4 IMPLANT
KIT BASIN OR (CUSTOM PROCEDURE TRAY) ×2 IMPLANT
LUBRICANT JELLY K Y 4OZ (MISCELLANEOUS) ×2 IMPLANT
NEEDLE HYPO 22GX1.5 SAFETY (NEEDLE) ×2 IMPLANT
NS IRRIG 1000ML POUR BTL (IV SOLUTION) ×2 IMPLANT
PACK LITHOTOMY IV (CUSTOM PROCEDURE TRAY) ×2 IMPLANT
PAD ABD 8X10 STRL (GAUZE/BANDAGES/DRESSINGS) ×1 IMPLANT
PENCIL BUTTON HOLSTER BLD 10FT (ELECTRODE) ×2 IMPLANT
SUT CHROMIC 2 0 SH (SUTURE) IMPLANT
SUT CHROMIC 3 0 SH 27 (SUTURE) ×1 IMPLANT
SUT PROLENE 2 0 SH DA (SUTURE) IMPLANT
SUT VIC AB 2-0 UR6 27 (SUTURE) IMPLANT
SUT VIC AB 3-0 SH 27 (SUTURE)
SUT VIC AB 3-0 SH 27XBRD (SUTURE) IMPLANT
SYR 20CC LL (SYRINGE) ×2 IMPLANT
TAPE CLOTH SURG 4X10 WHT LF (GAUZE/BANDAGES/DRESSINGS) ×1 IMPLANT
TOWEL OR 17X26 10 PK STRL BLUE (TOWEL DISPOSABLE) ×2 IMPLANT
TOWEL OR NON WOVEN STRL DISP B (DISPOSABLE) ×2 IMPLANT
YANKAUER SUCT BULB TIP 10FT TU (MISCELLANEOUS) ×2 IMPLANT

## 2014-03-20 NOTE — Transfer of Care (Signed)
Immediate Anesthesia Transfer of Care Note  Patient: Matthew Daniel  Procedure(s) Performed: Procedure(s): EXAM UNDER ANESTHESIA with SUPERFICIAL FISTULOTOMY WITH MARSUPIALIZATION (N/A)  Patient Location: PACU  Anesthesia Type:General  Level of Consciousness: sedated  Airway & Oxygen Therapy: Patient Spontanous Breathing and Patient connected to face mask oxygen  Post-op Assessment: Report given to RN and Post -op Vital signs reviewed and stable  Post vital signs: Reviewed and stable  Last Vitals:  Filed Vitals:   03/20/14 0808  BP: 160/85  Pulse: 84  Temp: 36.7 C  Resp: 20    Complications: No apparent anesthesia complications

## 2014-03-20 NOTE — Anesthesia Preprocedure Evaluation (Addendum)
Anesthesia Evaluation  Patient identified by MRN, date of birth, ID band Patient awake    Reviewed: Allergy & Precautions, NPO status , Patient's Chart, lab work & pertinent test results  History of Anesthesia Complications Negative for: history of anesthetic complications  Airway Mallampati: II  TM Distance: >3 FB Neck ROM: Full    Dental  (+) Teeth Intact, Dental Advisory Given   Pulmonary neg pulmonary ROS,    Pulmonary exam normal       Cardiovascular negative cardio ROS      Neuro/Psych negative neurological ROS  negative psych ROS   GI/Hepatic negative GI ROS, Neg liver ROS,   Endo/Other  negative endocrine ROS  Renal/GU negative Renal ROS     Musculoskeletal   Abdominal   Peds  Hematology   Anesthesia Other Findings   Reproductive/Obstetrics                            Anesthesia Physical Anesthesia Plan  ASA: II  Anesthesia Plan: General   Post-op Pain Management:    Induction: Intravenous  Airway Management Planned: Oral ETT  Additional Equipment:   Intra-op Plan:   Post-operative Plan: Extubation in OR  Informed Consent: I have reviewed the patients History and Physical, chart, labs and discussed the procedure including the risks, benefits and alternatives for the proposed anesthesia with the patient or authorized representative who has indicated his/her understanding and acceptance.   Dental advisory given  Plan Discussed with: CRNA, Anesthesiologist and Surgeon  Anesthesia Plan Comments:        Anesthesia Quick Evaluation

## 2014-03-20 NOTE — Op Note (Signed)
03/20/2014  11:01 AM  PATIENT:  Matthew Daniel  49 y.o. male  Patient Care Team: Denita Lung, MD as PCP - General (Family Medicine)  PRE-OPERATIVE DIAGNOSIS:  Perirectal Fistula  POST-OPERATIVE DIAGNOSIS:  Perirectal Fistula  PROCEDURE:  Procedure(s): EXAM UNDER ANESTHESIA with SUPERFICIAL FISTULOTOMY WITH MARSUPIALIZATION  SURGEON:  Surgeon(s): Michael Boston, MD  ASSISTANT: RN   ANESTHESIA:   local and general  EBL:  Total I/O In: -  Out: 10 [Blood:10]  Delay start of Pharmacological VTE agent (>24hrs) due to surgical blood loss or risk of bleeding:  no  DRAINS: none   SPECIMEN:  Source of Specimen:  ANAL FISTULOUS TRACT  DISPOSITION OF SPECIMEN:  PATHOLOGY  COUNTS:  YES  PLAN OF CARE: Discharge to home after PACU  PATIENT DISPOSITION:  PACU - hemodynamically stable.  INDICATION: Patient with probable perirectal fistula.  I recommended examination and surgical treatment:  The anatomy & physiology of the anorectal region was discussed.  We discussed the pathophysiology of anorectal abscess and fistula.  Differential diagnosis was discussed.  Natural history progression was discussed.   I stressed the importance of a bowel regimen to have daily soft bowel movements to minimize progression of disease.     The patient's condition is not adequately controlled.  Non-operative treatment has not healed the fistula.  Therefore, I recommended examination under anaesthesia to confirm the diagnosis and treat the fistula.  I discussed techniques that may be required such as fistulotomy, ligation by LIFT technique, and/or seton placement.  Benefits & alternatives discussed.  I noted a good likelihood this will help address the problem, but sometimes repeat operations and prolonged healing times may occur.  Risks such as bleeding, pain, recurrence, reoperation, incontinence, heart attack, death, and other risks were discussed.      Educational handouts further explaining the  pathology, treatment options, and bowel regimen were given.  The patient expressed understanding & wishes to proceed.  We will work to coordinate surgery for a mutually convenient time.   OR FINDINGS: Patient had a posterior midline superficial fistula.    External location Right posterior lateral (5 o'clock)  Internal location : posterior midline anal crypt about 1 cm from anal verge.  DESCRIPTION:   Informed consent was confirmed. Patient underwent general anesthesia without difficulty. Patient was placed into lithotomy positioning.  The perianal region was prepped and draped in sterile fashion. Surgical timeout confirmed or plan.  I did digital rectal examination and then transitioned over to anoscopy to get a sense of the anatomy.  I did place a probe through the external opening.   The tract was in the posterior midline seen rather superficial to the sphincters.  I cannot get the tip to come through the anal crypts of the that was my suspicion.  I also injected the track with methylene blue mixed with hydrogen peroxide.  With this I was able to locate a staining at the posterior midline anal crypts but no large opening.  The tract did feel superficial, concerning for a probable superificial fistula.  No abscess located.  I began to excise the external opening with a radial biconcave incision around it.  I transitioned to cautery and help free the fistulous tract circumferentially all way down towards the anal verge.  I again confirmed that the fistula's tract was superficial to the sphincters.  I made an excision of the anal crypts in the posterior midline through superficial incision.  We removed the intact fistulous tract off the sphincter complex  as it was superficial to it.  I trimmed excess anoderm & skin tissues including the right posterior lateral internal hemorrhoid and some external hemorrhoidal skin to help have a wide open flattened area.  I assured hemostasis.  Sphincter complex  unharmed.  I left the wound open by marsupialization.  I ran a 3-0 chromic suture at the apex of the wound in the proximal posterior anal canal.  I ran it along the right posteriolateral side to the most distal component.  I then ran another stitch for the left lateral aspect.  This left a nice open 5 x 3 cm wound going from the proximal anal canal to the external perianal skin.  I reexamined the anal canal.   There is was no narrowing.  Hemostasis was excellent.  I repeated anoscopy and examination.  Hemostasis was good. I covered the external wounds with dubucaine topical anesthesia and fluff gauze. Patient is being extubated go to recovery room.  I am about to discuss the patient's status to the family.  Instructions are written as well.  [name], M.D., F.A.C.S. Gastrointestinal and Minimally Invasive Surgery [facility] [address], [place], [place] [id number] [phone] Main / Paging

## 2014-03-20 NOTE — Anesthesia Postprocedure Evaluation (Signed)
Anesthesia Post Note  Patient: Matthew Daniel  Procedure(s) Performed: Procedure(s) (LRB): EXAM UNDER ANESTHESIA with SUPERFICIAL FISTULOTOMY WITH MARSUPIALIZATION (N/A)  Anesthesia type: general  Patient location: PACU  Post pain: Pain level controlled  Post assessment: Patient's Cardiovascular Status Stable  Last Vitals:  Filed Vitals:   03/20/14 1225  BP: 115/64  Pulse: 66  Temp: 36.4 C  Resp: 12    Post vital signs: Reviewed and stable  Level of consciousness: sedated  Complications: No apparent anesthesia complications

## 2014-03-20 NOTE — Discharge Instructions (Signed)
ANORECTAL SURGERY:  POST OPERATIVE INSTRUCTIONS  1. Take your usually prescribed home medications unless otherwise directed. 2. DIET: Follow a light bland diet the first 24 hours after arrival home, such as soup, liquids, crackers, etc.  Be sure to include lots of fluids daily.  Avoid fast food or heavy meals as your are more likely to get nauseated.  Eat a low fat the next few days after surgery.   3. PAIN CONTROL: a. Pain is best controlled by a usual combination of three different methods TOGETHER: i. Ice/Heat ii. Over the counter pain medication iii. Prescription pain medication b. Most patients will experience some swelling and discomfort in the anus/rectal area. and incisions.  Ice packs or heat (30-60 minutes up to 6 times a day) will help. Use ice for the first few days to help decrease swelling and bruising, then switch to heat such as warm towels, sitz baths, warm baths, etc to help relax tight/sore spots and speed recovery.  Some people prefer to use ice alone, heat alone, alternating between ice & heat.  Experiment to what works for you.  Swelling and bruising can take several weeks to resolve.   c. It is helpful to take an over-the-counter pain medication regularly for the first few weeks.  Choose one of the following that works best for you: i. Naproxen (Aleve, etc)  Two 261m tabs twice a day ii. Ibuprofen (Advil, etc) Three 2015mtabs four times a day (every meal & bedtime) iii. Acetaminophen (Tylenol, etc) 500-65049mour times a day (every meal & bedtime) d. A  prescription for pain medication (such as oxycodone, hydrocodone, etc) should be given to you upon discharge.  Take your pain medication as prescribed.  i. If you are having problems/concerns with the prescription medicine (does not control pain, nausea, vomiting, rash, itching, etc), please call us [phone] see if we need to switch you to a different pain medicine that will work better for you and/or control your  side effect better. ii. If you need a refill on your pain medication, please contact your pharmacy.  They will contact our office to request authorization. Prescriptions will not be filled after 5 pm or on week-ends.  Use a Sitz Bath 4-8 times a day for relief A sitz bath is a warm water bath taken in the sitting position that covers only the hips and buttocks. It may be used for either healing or hygiene purposes. Sitz baths are also used to relieve pain, itching, or muscle spasms. The water may contain medicine. Moist heat will help you heal and relax.  HOME CARE INSTRUCTIONS  Take 3 to 4 sitz baths a day. 1. Fill the bathtub half full with warm water. 2. Sit in the water and open the drain a little. 3. Turn on the warm water to keep the tub half full. Keep the water running constantly. 4. Soak in the water for 15 to 20 minutes. 5. After the sitz bath, pat the affected area dry first. SEEK MEDICAL CARE IF:  You get worse instead of better. Stop the sitz baths if you get worse.   4. KEEP YOUR BOWELS REGULAR a. The goal is one bowel movement a day b. Avoid getting constipated.  Between the surgery and the pain medications, it is common to experience some constipation.  Increasing fluid intake and taking a fiber supplement (such as Metamucil, Citrucel, FiberCon, MiraLax, etc) 1-2 times a day regularly will usually help prevent this problem from occurring.  A  mild laxative (prune juice, Milk of Magnesia, MiraLax, etc) should be taken according to package directions if there are no bowel movements after 48 hours. c. Watch out for diarrhea.  If you have many loose bowel movements, simplify your diet to bland foods & liquids for a few days.  Stop any stool softeners and decrease your fiber supplement.  Switching to mild anti-diarrheal medications (Kayopectate, Pepto Bismol) can help.  If this worsens or does not improve, please call us.  5. Wound Care a. Remove your bandages the day after surgery.   Unless discharge instructions indicate otherwise, leave your bandage dry and in place overnight.  Remove the bandage during your first bowel movement.   b. Allow the wound packing to fall out over the next few days.  You can trim exposed gauze / ribbon as it falls out.  You do not need to repack the wound unless instructed otherwise.  Wear an absorbent pad or soft cotton gauze in your underwear as needed to catch any drainage and help keep the area  c. Keep the area clean and dry.  Bathe / shower every day.  Keep the area clean by showering / bathing over the incision / wound.   It is okay to soak an open wound to help wash it.  Wet wipes or showers / gentle washing after bowel movements is often less traumatic than regular toilet paper. d. Dennis Bast may have some styrofoam-like soft packing in the rectum which will come out with the first bowel movement.  e. You will often notice bleeding with bowel movements.  This should slow down by the end of the first week of surgery f. Expect some drainage.  This should slow down, too, by the end of the first week of surgery.  Wear an absorbent pad or soft cotton gauze in your underwear until the drainage stops. 6. ACTIVITIES as tolerated:   a. You may resume regular (light) daily activities beginning the next day--such as daily self-care, walking, climbing stairs--gradually increasing activities as tolerated.  If you can walk 30 minutes without difficulty, it is safe to try more intense activity such as jogging, treadmill, bicycling, low-impact aerobics, swimming, etc. b. Save the most intensive and strenuous activity for last such as sit-ups, heavy lifting, contact sports, etc  Refrain from any heavy lifting or straining until you are off narcotics for pain control.   c. DO NOT PUSH THROUGH PAIN.  Let pain be your guide: If it hurts to do something, don't do it.  Pain is your body warning you to avoid that activity for another week until the pain goes down. d. You may  drive when you are no longer taking prescription pain medication, you can comfortably sit for long periods of time, and you can safely maneuver your car and apply brakes. e. Dennis Bast may have sexual intercourse when it is comfortable.  7. FOLLOW UP in our office a. Please call [facility] at [phone] to set up an appointment to see your surgeon in the office for a follow-up appointment approximately 2 weeks after your surgery. b. Make sure that you call for this appointment the day you arrive home to insure a convenient appointment time. 10. IF YOU HAVE DISABILITY OR FAMILY LEAVE FORMS, BRING THEM TO THE OFFICE FOR PROCESSING.  DO NOT GIVE THEM TO YOUR DOCTOR.        WHEN TO CALL us [phone]: 1. Poor pain control 2. Reactions / problems with new medications (rash/itching, nausea, etc)  3. Fever over 101.5 F (38.5 C) 4. Inability to urinate 5. Nausea and/or vomiting 6. Worsening swelling or bruising 7. Continued bleeding from incision. 8. Increased pain, redness, or drainage from the incision  The clinic staff is available to answer your questions during regular business hours (8:30am-5pm).  Please dont hesitate to call and ask to speak to one of our nurses for clinical concerns.   A surgeon from [facility] is always on call at the hospitals   If you have a medical emergency, go to the nearest emergency room or call 911.    [facility], [place], [place], [place], [place]  [address] ? MAIN: [phone] ? TOLL FREE: [phone] ? FAX [phone] [email or web address]   GETTING TO GOOD BOWEL HEALTH. Irregular bowel habits such as constipation and diarrhea can lead to many problems over time.  Having one soft bowel movement a day is the most important way to prevent further problems.  The anorectal canal is designed to handle stretching and feces to safely manage our ability to get rid of solid waste (feces, poop, stool) out  of our body.  BUT, hard constipated stools can act like ripping concrete bricks and diarrhea can be a burning fire to this very sensitive area of our body, causing inflamed hemorrhoids, anal fissures, increasing risk is perirectal abscesses, abdominal pain/bloating, an making irritable bowel worse.     The goal: ONE SOFT BOWEL MOVEMENT A DAY!  To have soft, regular bowel movements:   Drink at least 8 tall glasses of water a day.    Take plenty of fiber.  Fiber is the undigested part of plant food that passes into the colon, acting s natures broom to encourage bowel motility and movement.  Fiber can absorb and hold large amounts of water. This results in a larger, bulkier stool, which is soft and easier to pass. Work gradually over several weeks up to 6 servings a day of fiber (25g a day even more if needed) in the form of: o Vegetables -- Root (potatoes, carrots, turnips), leafy green (lettuce, salad greens, celery, spinach), or cooked high residue (cabbage, broccoli, etc) o Fruit -- Fresh (unpeeled skin & pulp), Dried (prunes, apricots, cherries, etc ),  or stewed ( applesauce)  o Whole grain breads, pasta, etc (whole wheat)  o Bran cereals   Bulking Agents -- This type of water-retaining fiber generally is easily obtained each day by one of the following:  o Psyllium bran -- The psyllium plant is remarkable because its ground seeds can retain so much water. This product is available as Metamucil, Konsyl, Effersyllium, Per Diem Fiber, or the less expensive generic preparation in drug and health food stores. Although labeled a laxative, it really is not a laxative.  o Methylcellulose -- This is another fiber derived from wood which also retains water. It is available as Citrucel. o Polyethylene Glycol - and artificial fiber commonly called Miralax or Glycolax.  It is helpful for people with gassy or bloated feelings with regular fiber o Flax Seed - a less gassy fiber than psyllium  No reading or  other relaxing activity while on the toilet. If bowel movements take longer than 5 minutes, you are too constipated  AVOID CONSTIPATION.  High fiber and water intake usually takes care of this.  Sometimes a laxative is needed to stimulate more frequent bowel movements, but   Laxatives are not a good long-term solution as it can wear the colon out.  o Osmotics (Milk of Magnesia, Fleets phosphosoda, Magnesium citrate, MiraLax, GoLytely) are safer than  o Stimulants (Senokot, Castor Oil, Dulcolax, Ex Lax)    o Do not take laxatives for more than 7days in a row.   IF SEVERELY CONSTIPATED, try a Bowel Retraining Program: o Do not use laxatives.  o Eat a diet high in roughage, such as bran cereals and leafy vegetables.  o Drink six (6) ounces of prune or apricot juice each morning.  o Eat two (2) large servings of stewed fruit each day.  o Take one (1) heaping tablespoon of a psyllium-based bulking agent twice a day. Use sugar-free sweetener when possible to avoid excessive calories.  o Eat a normal breakfast.  o Set aside 15 minutes after breakfast to sit on the toilet, but do not strain to have a bowel movement.  o If you do not have a bowel movement by the third day, use an enema and repeat the above steps.   Controlling diarrhea o Switch to liquids and simpler foods for a few days to avoid stressing your intestines further. o Avoid dairy products (especially milk & ice cream) for a short time.  The intestines often can lose the ability to digest lactose when stressed. o Avoid foods that cause gassiness or bloating.  Typical foods include beans and other legumes, cabbage, broccoli, and dairy foods.  Every person has some sensitivity to other foods, so listen to our body and avoid those foods that trigger problems for you. o Adding fiber (Citrucel, Metamucil, psyllium, Miralax) gradually can help thicken stools by absorbing excess fluid and retrain the intestines to act more normally.  Slowly  increase the dose over a few weeks.  Too much fiber too soon can backfire and cause cramping & bloating. o Probiotics (such as active yogurt, Align, etc) may help repopulate the intestines and colon with normal bacteria and calm down a sensitive digestive tract.  Most studies show it to be of mild help, though, and such products can be costly. o Medicines: - Bismuth subsalicylate (ex. Kayopectate, Pepto Bismol) every 30 minutes for up to 6 doses can help control diarrhea.  Avoid if pregnant. - Loperamide (Immodium) can slow down diarrhea.  Start with two tablets (4mg  total) first and then try one tablet every 6 hours.  Avoid if you are having fevers or severe pain.  If you are not better or start feeling worse, stop all medicines and call your doctor for advice o Call your doctor if you are getting worse or not better.  Sometimes further testing (cultures, endoscopy, X-ray studies, bloodwork, etc) may be needed to help diagnose and treat the cause of the diarrhea.  Managing Pain  Pain after surgery or related to activity is often due to strain/injury to muscle, tendon, nerves and/or incisions.  This pain is usually short-term and will improve in a few months.   Many people find it helpful to do the following things TOGETHER to help speed the process of healing and to get back to regular activity more quickly:  1. Avoid heavy physical activity a.  no lifting greater than 20 pounds b. Do not push through the pain.  Listen to your body and avoid positions and maneuvers than reproduce the pain c. Walking is okay as tolerated, but go slowly and stop when getting sore.  d. Remember: If it hurts to do it, then dont do it! 2. Take Anti-inflammatory medication  a. Take with food/snack around the clock for 1-2 weeks  i. This helps the muscle and nerve tissues become less irritable and calm down faster b. Choose ONE of the following over-the-counter medications: i. Naproxen 220mg  tabs (ex. Aleve) 1-2  pills twice a day  ii. Ibuprofen 200mg  tabs (ex. Advil, Motrin) 3-4 pills with every meal and just before bedtime iii. Acetaminophen 500mg  tabs (Tylenol) 1-2 pills with every meal and just before bedtime 3. Use a Heating pad or Ice/Cold Pack a. 4-6 times a day b. May use warm bath/hottub  or showers 4. Try Gentle Massage and/or Stretching  a. at the area of pain many times a day b. stop if you feel pain - do not overdo it  Try these steps together to help you body heal faster and avoid making things get worse.  Doing just one of these things may not be enough.    If you are not getting better after two weeks or are noticing you are getting worse, contact our office for further advice; we may need to re-evaluate you & see what other things we can do to help.

## 2014-03-20 NOTE — H&P (Signed)
Matthew  [address] [place] Daniel, [place] [address] Phone: [phone] FAX: [place]  1965-03-29 [id number]  CARE TEAM:  PCP: Wyatt Haste, Matthew  Outpatient Care Team: Daniel Care Team: Denita Lung, Matthew as PCP - General (Family Medicine)  Inpatient Treatment Team: Treatment Team: Attending Provider: Michael Daniel, [place] Daniel is a 49 y.o.male who presents today for surgical evaluation   Reason for evaluation: anal fistula  The Daniel is a 49 year old male who presents with anal itching. Daniel sent to me by his primary care physician Dr. Redmond School for concern of anal fistula Pleasant young male. Rectal abscess. Required incision and drainage last year. [name]. Followed up with surgery. Wound seemed to heal up. Then had recurrent drainage. Lost to follow-up. Daniel struggles with chronic constipation. Diverticulosis. Had colonoscopy 2 years ago. 2 polyps removed. Daniel now has bowel movement once or twice a day with the help of the MiraLAX. Because of persistently recurrent drainage and bleeding, surgical reevaluation requested. No fevers or chills. No history of Crohn's or colitis. No irritable bowel syndrome. No sick contacts or travel history. No fall or trauma.  Past Medical History  Diagnosis Date  . History of kidney stones     Past Surgical History  Procedure Laterality Date  . Refractive surgery    . Eye surgery    . Colonoscopy    . No past surgeries      History   Social History  . Marital Status: Single    Spouse Name: N/A    Number of Children: N/A  . Years of Education: N/A   Occupational History  . Not on file.   Social History Main Topics  . Smoking status: Never Smoker   . Smokeless tobacco: Never Used  . Alcohol Use: No  . Drug Use: No  . Sexual Activity: Not on file   Other Topics Concern  . Not on file    Social History Narrative    History reviewed. No pertinent family history.  Current Facility-Administered Medications  Medication Dose Route Frequency Provider Last Rate Last Dose  . bupivacaine liposome (EXPAREL) 1.3 % injection 266 mg  20 mL Infiltration On Call to OR Michael Boston, Matthew      . ceFAZolin (ANCEF) IVPB 2 g/50 mL premix  2 g Intravenous On Call to [place], Matthew      . metroNIDAZOLE (FLAGYL) IVPB 500 mg  500 mg Intravenous On Call to OR Michael Boston, Matthew       Facility-Administered Medications Ordered in Other Encounters  Medication Dose Route Frequency Provider Last Rate Last Dose  . lactated ringers infusion    Continuous PRN Lollie Sails, CRNA         Allergies  Allergen Reactions  . Oxycodone Nausea And Vomiting  . Sulfa Antibiotics Hives    ROS: Constitutional:  No fevers, chills, sweats.  Weight stable Eyes:  No vision changes, No discharge HENT:  No sore throats, nasal drainage Lymph: No neck swelling, No bruising easily Pulmonary:  No cough, productive sputum CV: No orthopnea, PND   No exertional chest/neck/shoulder/arm pain. GI:  No personal nor family history of GI/colon cancer, inflammatory bowel disease, irritable bowel syndrome, allergy such as Celiac Sprue, dietary/dairy problems, colitis, ulcers nor gastritis.  No recent sick contacts/gastroenteritis.  No travel outside the country.  No changes in diet. Renal:  No UTIs, No hematuria Genital:  No drainage, bleeding, masses Musculoskeletal: No severe joint pain.  Good ROM major joints Skin:  No sores or lesions.  No rashes Heme/Lymph:  No easy bleeding.  No swollen lymph nodes Neuro: No focal weakness/numbness.  No seizures Psych: No suicidal ideation.  No hallucinations  BP 160/85 mmHg  Pulse 84  Temp(Src) 98.1 F (36.7 C) (Oral)  Resp 20  Ht 6\' 1"  (1.854 m)  Wt 245 lb 9.6 oz (111.403 kg)  BMI 32.41 kg/m2  SpO2 100%  Physical Exam: General: Pt awake/alert/oriented x4 in no major  acute distress Eyes: PERRL, normal EOM. Sclera nonicteric Neuro: CN II-XII intact w/o focal sensory/motor deficits. Lymph: No head/neck/groin lymphadenopathy Psych:  No delerium/psychosis/paranoia HENT: Normocephalic, Mucus membranes moist.  No thrush Neck: Supple, No tracheal deviation Chest: No pain.  Good respiratory excursion. CV:  Pulses intact.  Regular rhythm Abdomen: Soft, Nondistended.  Nontender.  No incarcerated hernias. Ext:  SCDs BLE.  No significant edema.  No cyanosis Skin: No petechiae / purpurea.  No major sores Musculoskeletal: No severe joint pain.  Good ROM major joints   Results:   Labs: No results found for this or any previous visit (from the past 48 hour(s)).  Imaging / Studies: No results found.  Medications / Allergies: per chart  Antibiotics: Anti-infectives    Start     Dose/Rate Route Frequency Ordered Stop   03/20/14 0809  ceFAZolin (ANCEF) IVPB 2 g/50 mL premix     2 g100 mL/hr over 30 Minutes Intravenous On call to O.R. 03/20/14 0809 03/21/14 0559   03/20/14 0809  metroNIDAZOLE (FLAGYL) IVPB 500 mg    Comments:  Pharmacy may adjust dosing strength, interval, or rate of medication as needed for optimal therapy for the Daniel Send with Daniel on call to the OR.  Anesthesia to complete antibiotic administration <88min prior to incision per [facility].   500 mg100 mL/hr over 60 Minutes Intravenous On call to O.R. 03/20/14 0809 03/21/14 0559      Assessment  Bruce Donath Ogawa  49 y.o. male  Day of Surgery  Procedure(s): REPAIR OF PERIRECTAL FISTULA AND EXAM UNDER ANESTHESIA  Problem List:  Active Problems:   * No active hospital problems. *   Anal fistula  Plan:  Posterior midline opening with cord felt superficial to the sphincters and to the posterior midline rectum. Very suspicious for anorectal fistula especially given history of chronic intermittent drainage and prior rectal abscess drainage.  I think this will require  examination under anesthesia was excision versus fistulotomy. Should be an outpatient procedure:  The anatomy & physiology of the anorectal region was discussed. We discussed the pathophysiology of anorectal abscess and fistula. Differential diagnosis was discussed. Natural history progression was discussed. I stressed the importance of a bowel regimen to have daily soft bowel movements to minimize progression of disease.  The Daniel's condition is not adequately controlled. Non-operative treatment has not healed the fistula. Therefore, I recommended examination under anaesthesia to confirm the diagnosis and treat the fistula. I discussed techniques that may be required such as fistulotomy, ligation by LIFT technique, and/or seton placement. Benefits & alternatives discussed. I noted a good likelihood this will help address the problem, but sometimes repeat operations and prolonged healing times may occur. Risks such as bleeding, pain, recurrence, reoperation, incontinence, heart attack, death, and other risks were discussed.  Educational handouts further explaining the pathology, treatment options, and bowel regimen were given. The Daniel expressed understanding & wishes to  proceed. We will work to coordinate surgery for a mutually convenient time.  -VTE prophylaxis- SCDs, etc -mobilize as tolerated to help recovery    [name], M.D., F.A.C.S. Gastrointestinal and Minimally Invasive Surgery [facility] [address] [place], [place] [address] [phone] Main / Paging   03/20/2014  Note: Portions of this report may have been transcribed using voice recognition software. Every effort was made to ensure accuracy; however, inadvertent computerized transcription errors may be present.   Any transcriptional errors that result from this process are unintentional.

## 2014-03-21 ENCOUNTER — Encounter (HOSPITAL_COMMUNITY): Payer: Self-pay | Admitting: Surgery

## 2014-09-17 ENCOUNTER — Ambulatory Visit (INDEPENDENT_AMBULATORY_CARE_PROVIDER_SITE_OTHER): Payer: 59 | Admitting: Emergency Medicine

## 2014-09-17 ENCOUNTER — Ambulatory Visit (INDEPENDENT_AMBULATORY_CARE_PROVIDER_SITE_OTHER): Payer: 59

## 2014-09-17 VITALS — BP 140/82 | HR 85 | Temp 98.2°F | Resp 18 | Ht 72.0 in | Wt 243.0 lb

## 2014-09-17 DIAGNOSIS — M25572 Pain in left ankle and joints of left foot: Secondary | ICD-10-CM | POA: Diagnosis not present

## 2014-09-17 DIAGNOSIS — S93402A Sprain of unspecified ligament of left ankle, initial encounter: Secondary | ICD-10-CM

## 2014-09-17 MED ORDER — NAPROXEN SODIUM 550 MG PO TABS: 550.0000 mg | ORAL_TABLET | Freq: Two times a day (BID) | ORAL | Status: DC

## 2014-09-17 NOTE — Patient Instructions (Signed)

## 2014-09-17 NOTE — Progress Notes (Addendum)
Subjective:  Patient ID: Matthew Daniel, male    DOB: May 22, 1965  Age: 49 y.o. MRN: [id number]  CC: Ankle Pain   HPI Matthew Daniel presents  he suffered an injury to his ankle twisting it in an inversion fashion over week ago. He has cared for with elevation and ice and over-the-counter non-steroidal's. He's thinks by now it should've been improved. He still has pain and is being very cautious with posture. He denies any improvement with over-the-counter medication  History Matthew Daniel has a past medical history of History of kidney stones.   He has past surgical history that includes Refractive surgery; Eye surgery; Colonoscopy; No past surgeries; and Incision and drainage perirectal abscess (N/A, 03/20/2014).   His  family history is not on file.  He   reports that he has never smoked. He has never used smokeless tobacco. He reports that he does not drink alcohol or use illicit drugs.  Outpatient Prescriptions Prior to Visit  Medication Sig Dispense Refill  . ibuprofen (ADVIL,MOTRIN) 200 MG tablet Take 400 mg by mouth every 6 (six) hours as needed for headache or moderate pain.    . Multiple Vitamin (MULTIVITAMIN) tablet Take 0.5 tablets by mouth 2 (two) times daily.     Marland Kitchen OVER THE COUNTER MEDICATION Place 1 drop into both eyes daily as needed (dryness). Perservative eye drops.    . traMADol (ULTRAM) 50 MG tablet Take 1-2 tablets (50-100 mg total) by mouth every 6 (six) hours as needed for moderate pain or severe pain. (Patient not taking: Reported on 09/17/2014) 40 tablet 0   No facility-administered medications prior to visit.    History   Social History  . Marital Status: Single    Spouse Name: N/A  . Number of Children: N/A  . Years of Education: N/A   Social History Main Topics  . Smoking status: Never Smoker   . Smokeless tobacco: Never Used  . Alcohol Use: No  . Drug Use: No  . Sexual Activity: Not on file   Other Topics Concern  . None   Social History  Narrative     Review of Systems  Constitutional: Negative for fever, chills and appetite change.  HENT: Negative for congestion, ear pain, postnasal drip, sinus pressure and sore throat.   Eyes: Negative for pain and redness.  Respiratory: Negative for cough, shortness of breath and wheezing.   Cardiovascular: Negative for leg swelling.  Gastrointestinal: Negative for nausea, vomiting, abdominal pain, diarrhea, constipation and blood in stool.  Endocrine: Negative for polyuria.  Genitourinary: Negative for dysuria, urgency, frequency and flank pain.  Musculoskeletal: Negative for gait problem.  Skin: Negative for rash.  Neurological: Negative for weakness and headaches.  Psychiatric/Behavioral: Negative for confusion and decreased concentration. The patient is not nervous/anxious.     Objective:  BP 140/82 mmHg  Pulse 85  Temp(Src) 98.2 F (36.8 C) (Oral)  Resp 18  Ht 6' (1.829 m)  Wt 243 lb (110.224 kg)  BMI 32.95 kg/m2  SpO2 98%  Physical Exam  Constitutional: He is oriented to person, place, and time. He appears well-developed and well-nourished.  HENT:  Head: Normocephalic and atraumatic.  Eyes: Conjunctivae are normal. Pupils are equal, round, and reactive to light.  Pulmonary/Chest: Effort normal.  Musculoskeletal: He exhibits no edema.  Neurological: He is alert and oriented to person, place, and time.  Skin: Skin is dry.  Psychiatric: He has a normal mood and affect. His behavior is normal. Thought content normal.  Assessment & Plan:   Matthew Daniel was seen today for ankle pain.  Diagnoses and all orders for this visit:  Left ankle pain Orders: -     DG Ankle Complete Left; Future  Sprain of left ankle, initial encounter  Other orders -     naproxen sodium (ANAPROX DS) 550 MG tablet; Take 1 tablet (550 mg total) by mouth 2 (two) times daily with a meal.   I am having Matthew Daniel start on naproxen sodium. I am also having him maintain his  multivitamin, ibuprofen, OVER THE COUNTER MEDICATION, and traMADol.  Meds ordered this encounter  Medications  . naproxen sodium (ANAPROX DS) 550 MG tablet    Sig: Take 1 tablet (550 mg total) by mouth 2 (two) times daily with a meal.    Dispense:  40 tablet    Refill:  0    Appropriate red flag conditions were discussed with the patient as well as actions that should be taken.  Patient expressed his understanding.  Follow-up: Return if symptoms worsen or fail to improve.  Matthew Culver, MD    [facility] reading (PRIMARY) by  Dr. Ouida Daniel negative.

## 2014-12-15 ENCOUNTER — Ambulatory Visit (INDEPENDENT_AMBULATORY_CARE_PROVIDER_SITE_OTHER): Payer: 59 | Admitting: Family Medicine

## 2014-12-15 ENCOUNTER — Encounter: Payer: Self-pay | Admitting: Family Medicine

## 2014-12-15 VITALS — BP 122/80 | HR 88 | Ht 72.0 in | Wt 239.0 lb

## 2014-12-15 DIAGNOSIS — Z0000 Encounter for general adult medical examination without abnormal findings: Secondary | ICD-10-CM

## 2014-12-15 DIAGNOSIS — Z87442 Personal history of urinary calculi: Secondary | ICD-10-CM | POA: Diagnosis not present

## 2014-12-15 DIAGNOSIS — Z8719 Personal history of other diseases of the digestive system: Secondary | ICD-10-CM

## 2014-12-15 DIAGNOSIS — Z87898 Personal history of other specified conditions: Secondary | ICD-10-CM | POA: Diagnosis not present

## 2014-12-15 DIAGNOSIS — K219 Gastro-esophageal reflux disease without esophagitis: Secondary | ICD-10-CM | POA: Insufficient documentation

## 2014-12-15 DIAGNOSIS — Z86010 Personal history of colonic polyps: Secondary | ICD-10-CM | POA: Diagnosis not present

## 2014-12-15 LAB — COMPREHENSIVE METABOLIC PANEL
ALT: 30 U/L (ref 9–46)
AST: 25 U/L (ref 10–40)
Albumin: 4.4 g/dL (ref 3.6–5.1)
Alkaline Phosphatase: 54 U/L (ref 40–115)
BUN: 15 mg/dL (ref 7–25)
CO2: 27 mmol/L (ref 20–31)
Calcium: 9.1 mg/dL (ref 8.6–10.3)
Chloride: 103 mmol/L (ref 98–110)
Creat: 1.51 mg/dL — ABNORMAL HIGH (ref 0.60–1.35)
Glucose, Bld: 87 mg/dL (ref 65–99)
Potassium: 4.5 mmol/L (ref 3.5–5.3)
Sodium: 138 mmol/L (ref 135–146)
Total Bilirubin: 1.1 mg/dL (ref 0.2–1.2)
Total Protein: 7 g/dL (ref 6.1–8.1)

## 2014-12-15 LAB — CBC WITH DIFFERENTIAL/PLATELET
Basophils Absolute: 0 10*3/uL (ref 0.0–0.1)
Basophils Relative: 0 % (ref 0–1)
Eosinophils Absolute: 0 10*3/uL (ref 0.0–0.7)
Eosinophils Relative: 1 % (ref 0–5)
HCT: 41.2 % (ref 39.0–52.0)
Hemoglobin: 14.8 g/dL (ref 13.0–17.0)
Lymphocytes Relative: 50 % — ABNORMAL HIGH (ref 12–46)
Lymphs Abs: 1.9 10*3/uL (ref 0.7–4.0)
MCH: 31.2 pg (ref 26.0–34.0)
MCHC: 35.9 g/dL (ref 30.0–36.0)
MCV: 86.7 fL (ref 78.0–100.0)
MPV: 9.6 fL (ref 8.6–12.4)
Monocytes Absolute: 0.5 10*3/uL (ref 0.1–1.0)
Monocytes Relative: 13 % — ABNORMAL HIGH (ref 3–12)
Neutro Abs: 1.3 10*3/uL — ABNORMAL LOW (ref 1.7–7.7)
Neutrophils Relative %: 36 % — ABNORMAL LOW (ref 43–77)
Platelets: 244 10*3/uL (ref 150–400)
RBC: 4.75 MIL/uL (ref 4.22–5.81)
RDW: 13.7 % (ref 11.5–15.5)
WBC: 3.7 10*3/uL — ABNORMAL LOW (ref 4.0–10.5)

## 2014-12-15 LAB — LIPID PANEL
Cholesterol: 158 mg/dL (ref 125–200)
HDL: 40 mg/dL (ref >=40)
LDL Cholesterol: 102 mg/dL (ref <130)
Total CHOL/HDL Ratio: 4 Ratio (ref <=5.0)
Triglycerides: 81 mg/dL (ref <150)
VLDL: 16 mg/dL (ref <30)

## 2014-12-15 NOTE — Patient Instructions (Signed)
150 minutes a week of something physical  

## 2014-12-15 NOTE — Progress Notes (Signed)
   Subjective:    Patient ID: Matthew Daniel, male    DOB: 09/15/1965, 49 y.o.   MRN: [id number]  HPI He is here for complete examination. He has had difficulty in the past with anal fissures and did recently have a fistulotomy. He states that since then he has had no rectal or lower abdominal discomfort. He is now on MiraLAX and doing well. He has a past history of hemorrhoids as well as colonic polyps and has had one episode of diverticulitis. He did have a colonoscopy done in 2013. Rescheduled again in 2018. He does have reflux disease but presently is having no difficulty with that. He also has remote history of renal stones. He has not had difficulty with that in several years. Otherwise he has no particular concerns or complaints. Family and social history as well as immunizations and health maintenance was reviewed.   Review of Systems  All other systems reviewed and are negative.      Objective:   Physical Exam BP 122/80 mmHg  Pulse 88  Ht 6' (1.829 m)  Wt 239 lb (108.41 kg)  BMI 32.41 kg/m2  SpO2 97%  General Appearance:    Alert, cooperative, no distress, appears stated age  Head:    Normocephalic, without obvious abnomality, atraumatic  Eyes:    PERRL, conjunctiva/corneas clear, EOM's intact, fundi    benign  Ears:    Normal TM's and external ear canals  Nose:   Nares normal, mucosa normal, no drainage or sinus   tenderness  Throat:   Lips, mucosa, and tongue normal; teeth and gums normal  Neck:   Supple, no lymphadenopathy;  thyroid:  no   enlargement/tenderness/nodules; no carotid   bruit or JVD  Back:    Spine nontender, no curvature, ROM normal, no CVA     tenderness  Lungs:     Clear to auscultation bilaterally without wheezes, rales or     ronchi; respirations unlabored  Chest Wall:    No tenderness or deformity   Heart:    Regular rate and rhythm, S1 and S2 normal, no murmur, rub   or gallop  Breast Exam:    No chest wall tenderness, masses or gynecomastia    Abdomen:     Soft, non-tender, nondistended, normoactive bowel sounds,    no masses, no hepatosplenomegaly  Genitalia:  deffered  Rectal:  deffered  Extremities:   No clubbing, cyanosis or edema  Pulses:   2+ and symmetric all extremities  Skin:   Skin color, texture, turgor normal, no rashes or lesions  Lymph nodes:   Cervical, supraclavicular, and axillary nodes normal  Neurologic:   CNII-XII intact, normal strength, sensation and gait; reflexes 2+ and symmetric throughout          Psych:   Normal mood, affect, hygiene and grooming.          Assessment & Plan:  Routine general medical examination at a health care facility - Plan: CBC with Differential/Platelet, Comprehensive metabolic panel, Lipid panel  History of renal stone  History of hemorrhoids  History of colonic polyps  Gastroesophageal reflux disease without esophagitis  History of diverticulitis  overall he seems to be stable. I will do routine blood screening on him. He has further difficulties, he will let me know otherwise I will see him in one year.

## 2015-04-12 ENCOUNTER — Telehealth: Payer: Self-pay | Admitting: Family Medicine

## 2015-04-12 DIAGNOSIS — L919 Hypertrophic disorder of the skin, unspecified: Secondary | ICD-10-CM | POA: Diagnosis not present

## 2015-04-12 DIAGNOSIS — L738 Other specified follicular disorders: Secondary | ICD-10-CM | POA: Diagnosis not present

## 2015-04-12 DIAGNOSIS — L821 Other seborrheic keratosis: Secondary | ICD-10-CM | POA: Diagnosis not present

## 2015-04-12 NOTE — Telephone Encounter (Signed)
Called pt advised we need more info to complete his form.  He will check with [employer] and his high school

## 2015-04-12 NOTE — Telephone Encounter (Signed)
Pt come by and dropped off immunizations that he needs for school, and a record of what he had said he found it on the [facility] chart, system, said he needs it filled out what he has had, and then if he needs anything he can come back to get them, pt can be reached at  [phone] (M)

## 2015-05-15 ENCOUNTER — Other Ambulatory Visit (INDEPENDENT_AMBULATORY_CARE_PROVIDER_SITE_OTHER): Payer: 59

## 2015-05-15 DIAGNOSIS — Z111 Encounter for screening for respiratory tuberculosis: Secondary | ICD-10-CM

## 2015-05-17 DIAGNOSIS — Z111 Encounter for screening for respiratory tuberculosis: Secondary | ICD-10-CM

## 2015-05-17 LAB — TB SKIN TEST
Induration: 0 mm
TB Skin Test: NEGATIVE

## 2015-05-22 ENCOUNTER — Encounter: Payer: Self-pay | Admitting: Family Medicine

## 2015-05-30 ENCOUNTER — Encounter: Payer: Self-pay | Admitting: *Deleted

## 2015-09-20 DIAGNOSIS — H52223 Regular astigmatism, bilateral: Secondary | ICD-10-CM | POA: Diagnosis not present

## 2015-09-20 DIAGNOSIS — H524 Presbyopia: Secondary | ICD-10-CM | POA: Diagnosis not present

## 2015-09-20 DIAGNOSIS — H5202 Hypermetropia, left eye: Secondary | ICD-10-CM | POA: Diagnosis not present

## 2015-09-20 DIAGNOSIS — H5211 Myopia, right eye: Secondary | ICD-10-CM | POA: Diagnosis not present

## 2015-10-25 DIAGNOSIS — B353 Tinea pedis: Secondary | ICD-10-CM | POA: Diagnosis not present

## 2015-10-25 DIAGNOSIS — L602 Onychogryphosis: Secondary | ICD-10-CM | POA: Diagnosis not present

## 2015-10-25 DIAGNOSIS — B351 Tinea unguium: Secondary | ICD-10-CM | POA: Diagnosis not present

## 2016-04-17 DIAGNOSIS — R109 Unspecified abdominal pain: Secondary | ICD-10-CM | POA: Diagnosis not present

## 2016-04-17 DIAGNOSIS — K5901 Slow transit constipation: Secondary | ICD-10-CM | POA: Diagnosis not present

## 2016-04-17 DIAGNOSIS — K5730 Diverticulosis of large intestine without perforation or abscess without bleeding: Secondary | ICD-10-CM | POA: Diagnosis not present

## 2016-04-17 DIAGNOSIS — Z86010 Personal history of colonic polyps: Secondary | ICD-10-CM | POA: Diagnosis not present

## 2016-06-04 MED FILL — GAVILYTE-N SOLUTION: 420 | 1 days supply | Qty: 4000 | Fill #0

## 2016-06-05 DIAGNOSIS — K5730 Diverticulosis of large intestine without perforation or abscess without bleeding: Secondary | ICD-10-CM | POA: Diagnosis not present

## 2016-06-05 DIAGNOSIS — Z86010 Personal history of colonic polyps: Secondary | ICD-10-CM | POA: Diagnosis not present

## 2016-06-05 LAB — HM COLONOSCOPY

## 2016-08-28 ENCOUNTER — Encounter (HOSPITAL_COMMUNITY): Payer: Self-pay | Admitting: Emergency Medicine

## 2016-08-28 ENCOUNTER — Ambulatory Visit (HOSPITAL_COMMUNITY)
Admission: EM | Admit: 2016-08-28 | Discharge: 2016-08-28 | Disposition: A | Discharge status: Home / Self Care | Payer: 59 | Attending: Family Medicine | Admitting: Family Medicine

## 2016-08-28 DIAGNOSIS — L259 Unspecified contact dermatitis, unspecified cause: Secondary | ICD-10-CM | POA: Diagnosis not present

## 2016-08-28 MED ORDER — TRIAMCINOLONE ACETONIDE 0.1 % EX CREA: 1.0000 "application " | TOPICAL_CREAM | Freq: Two times a day (BID) | CUTANEOUS | 0 refills | Status: DC

## 2016-08-28 MED FILL — TRIAMCINOLONE ACETONIDE 0.1: 0.1 | 10 days supply | Qty: 30 | Fill #0

## 2016-08-28 NOTE — ED Triage Notes (Signed)
Pt reports a rash on his neck he developed around Tuesday.  States he washed his headphones with his scrubs and draped them around his neck after.  It was after that that he developed the rash.

## 2016-09-01 NOTE — ED Provider Notes (Signed)
  [facility]   [id number] 08/28/16 Arrival Time: 4709  ASSESSMENT & PLAN:  1. Contact dermatitis, unspecified contact dermatitis type, unspecified trigger    Meds ordered this encounter  Medications  . triamcinolone cream (KENALOG) 0.1 %    Sig: Apply 1 application topically 2 (two) times daily.    Dispense:  30 g    Refill:  0   Recommend f/u if not showing improvement within 48 hours, sooner if needed.  Reviewed expectations re: course of current medical issues. Questions answered. Outlined signs and symptoms indicating need for more acute intervention. Patient verbalized understanding. After Visit Summary given.   SUBJECTIVE:  Matthew Daniel is a 51 y.o. male who presents with complaint of rash around his neck. Approx 2 days. Itchy. Not sure if related to wearing headphones around neck. Afebrile. No specific aggravating or alleviating factors reported.. No h/o similar. No self treatment. No new exposures.  ROS: As per HPI.   OBJECTIVE:  Vitals:   08/28/16 1136  BP: (!) 162/82  Pulse: 69  Temp: 98.2 F (36.8 C)  TempSrc: Oral  SpO2: 98%     General appearance: alert; no distress Skin: patchy erythematous rash around neck; hive-like     Allergies  Allergen Reactions  . Oxycodone Nausea And Vomiting  . Sulfa Antibiotics Hives    PMHx, SurgHx, SocialHx, Medications, and Allergies were reviewed in the Visit Navigator and updated as appropriate.      Vanessa Kick, MD 09/01/16 [phone]

## 2016-09-25 DIAGNOSIS — H524 Presbyopia: Secondary | ICD-10-CM | POA: Diagnosis not present

## 2016-09-25 DIAGNOSIS — H52223 Regular astigmatism, bilateral: Secondary | ICD-10-CM | POA: Diagnosis not present

## 2016-10-16 ENCOUNTER — Encounter: Payer: Self-pay | Admitting: Family Medicine

## 2016-10-16 ENCOUNTER — Ambulatory Visit (INDEPENDENT_AMBULATORY_CARE_PROVIDER_SITE_OTHER): Payer: 59 | Admitting: Family Medicine

## 2016-10-16 VITALS — BP 140/80 | HR 71 | Resp 16 | Ht 72.0 in | Wt 247.4 lb

## 2016-10-16 DIAGNOSIS — K219 Gastro-esophageal reflux disease without esophagitis: Secondary | ICD-10-CM

## 2016-10-16 DIAGNOSIS — Z87442 Personal history of urinary calculi: Secondary | ICD-10-CM | POA: Diagnosis not present

## 2016-10-16 DIAGNOSIS — K5730 Diverticulosis of large intestine without perforation or abscess without bleeding: Secondary | ICD-10-CM | POA: Diagnosis not present

## 2016-10-16 DIAGNOSIS — Z0000 Encounter for general adult medical examination without abnormal findings: Secondary | ICD-10-CM

## 2016-10-16 DIAGNOSIS — Z8719 Personal history of other diseases of the digestive system: Secondary | ICD-10-CM | POA: Diagnosis not present

## 2016-10-16 DIAGNOSIS — R030 Elevated blood-pressure reading, without diagnosis of hypertension: Secondary | ICD-10-CM | POA: Diagnosis not present

## 2016-10-16 DIAGNOSIS — Z23 Encounter for immunization: Secondary | ICD-10-CM

## 2016-10-16 DIAGNOSIS — Z86010 Personal history of colonic polyps: Secondary | ICD-10-CM | POA: Diagnosis not present

## 2016-10-16 LAB — POCT URINALYSIS DIP (PROADVANTAGE DEVICE)
Bilirubin, UA: NEGATIVE
Blood, UA: NEGATIVE
Glucose, UA: NEGATIVE mg/dL
Ketones, POC UA: NEGATIVE mg/dL
Leukocytes, UA: NEGATIVE
Nitrite, UA: NEGATIVE
Protein Ur, POC: NEGATIVE mg/dL
Specific Gravity, Urine: 1.025
Urobilinogen, Ur: NEGATIVE
pH, UA: 6 (ref 5.0–8.0)

## 2016-10-16 NOTE — Progress Notes (Signed)
   Subjective:    Patient ID: Matthew Daniel, male    DOB: September 30, 1965, 51 y.o.   MRN: [id number]  HPI He is here for complete examination. He has no particular concerns or complaints. Does have a previous history of renal stones, hemorrhoids, colonic polyps, diverticulosis and within last several months diverticulitis. His reflux seems to be under good control. Presently he is on no medications. His work and home life are going well. Family and social history as well as health maintenance and immunizations was reviewed.   Review of Systems  All other systems reviewed and are negative.      Objective:   Physical Exam BP 140/80   Pulse 71   Resp 16   Ht 6' (1.829 m)   Wt 247 lb 6.4 oz (112.2 kg)   SpO2 98%   BMI 33.55 kg/m   General Appearance:    Alert, cooperative, no distress, appears stated age  Head:    Normocephalic, without obvious abnormality, atraumatic  Eyes:    PERRL, conjunctiva/corneas clear, EOM's intact, fundi    benign  Ears:    Normal TM's and external ear canals  Nose:   Nares normal, mucosa normal, no drainage or sinus   tenderness  Throat:   Lips, mucosa, and tongue normal; teeth and gums normal  Neck:   Supple, no lymphadenopathy;  thyroid:  no   enlargement/tenderness/nodules; no carotid   bruit or JVD     Lungs:     Clear to auscultation bilaterally without wheezes, rales or     ronchi; respirations unlabored      Heart:    Regular rate and rhythm, S1 and S2 normal, no murmur, rub   or gallop     Abdomen:     Soft, non-tender, nondistended, normoactive bowel sounds,    no masses, no hepatosplenomegaly  Genitalia:    Normal male external genitalia without lesions.  Testicles without masses.  No inguinal hernias.  Rectal:    Deferred   Extremities:   No clubbing, cyanosis or edema  Pulses:   2+ and symmetric all extremities  Skin:   Skin color, texture, turgor normal, no rashes or lesions  Lymph nodes:   Cervical, supraclavicular, and axillary  nodes normal  Neurologic:   CNII-XII intact, normal strength, sensation and gait; reflexes 2+ and symmetric throughout          Psych:   Normal mood, affect, hygiene and grooming.          Assessment & Plan:  Routine general medical examination at a health care facility - Plan: CBC with Differential/Platelet, Comprehensive metabolic panel, Lipid panel, POCT Urinalysis DIP (Proadvantage Device)  History of renal stone - Plan: CBC with Differential/Platelet, Comprehensive metabolic panel  History of hemorrhoids  History of colonic polyps  Gastroesophageal reflux disease without esophagitis  History of diverticulitis  Diverticulosis of large intestine without hemorrhage  Elevated blood pressure reading  Need for prophylactic vaccination and inoculation against influenza - Plan: Flu Vaccine QUAD 6+ mos PF IM (Fluarix Quad PF) Overall he is doing quite well. Discussed diet and exercise with him especially in regard to his blood pressure. He is to return here in 2 months for a recheck.

## 2016-10-16 NOTE — Patient Instructions (Signed)
20 minutes of something physical daily. Weight reduction and the easiest thing there is cutting back on carbs and the easiest way to remember that is "white food".

## 2016-10-17 LAB — COMPREHENSIVE METABOLIC PANEL
AG Ratio: 1.9 (calc) (ref 1.0–2.5)
ALT: 35 U/L (ref 9–46)
AST: 33 U/L (ref 10–35)
Albumin: 4.1 g/dL (ref 3.6–5.1)
Alkaline phosphatase (APISO): 47 U/L (ref 40–115)
BUN/Creatinine Ratio: 9 (calc) (ref 6–22)
BUN: 15 mg/dL (ref 7–25)
CO2: 24 mmol/L (ref 20–32)
Calcium: 8.7 mg/dL (ref 8.6–10.3)
Chloride: 109 mmol/L (ref 98–110)
Creat: 1.65 mg/dL — ABNORMAL HIGH (ref 0.70–1.33)
Globulin: 2.2 g/dL (calc) (ref 1.9–3.7)
Glucose, Bld: 98 mg/dL (ref 65–99)
Potassium: 4.1 mmol/L (ref 3.5–5.3)
Sodium: 139 mmol/L (ref 135–146)
Total Bilirubin: 0.5 mg/dL (ref 0.2–1.2)
Total Protein: 6.3 g/dL (ref 6.1–8.1)

## 2016-10-17 LAB — LIPID PANEL
Cholesterol: 166 mg/dL (ref <200)
HDL: 49 mg/dL (ref >40)
LDL Cholesterol (Calc): 100 mg/dL (calc) — ABNORMAL HIGH
Non-HDL Cholesterol (Calc): 117 mg/dL (calc) (ref <130)
Total CHOL/HDL Ratio: 3.4 (calc) (ref <5.0)
Triglycerides: 78 mg/dL (ref <150)

## 2016-10-17 LAB — CBC WITH DIFFERENTIAL/PLATELET
Basophils Absolute: 21 cells/uL (ref 0–200)
Basophils Relative: 0.5 %
Eosinophils Absolute: 118 cells/uL (ref 15–500)
Eosinophils Relative: 2.8 %
HCT: 39.8 % (ref 38.5–50.0)
Hemoglobin: 13.6 g/dL (ref 13.2–17.1)
Lymphs Abs: 1995 cells/uL (ref 850–3900)
MCH: 30.2 pg (ref 27.0–33.0)
MCHC: 34.2 g/dL (ref 32.0–36.0)
MCV: 88.2 fL (ref 80.0–100.0)
MPV: 10.3 fL (ref 7.5–12.5)
Monocytes Relative: 15.1 %
Neutro Abs: 1432 cells/uL — ABNORMAL LOW (ref 1500–7800)
Neutrophils Relative %: 34.1 %
Platelets: 211 10*3/uL (ref 140–400)
RBC: 4.51 10*6/uL (ref 4.20–5.80)
RDW: 13.8 % (ref 11.0–15.0)
Total Lymphocyte: 47.5 %
WBC mixed population: 634 cells/uL (ref 200–950)
WBC: 4.2 10*3/uL (ref 3.8–10.8)

## 2016-10-20 ENCOUNTER — Telehealth: Payer: Self-pay | Admitting: Family Medicine

## 2016-10-20 NOTE — Telephone Encounter (Signed)
Received requested info from [facility]. Sending back for review.

## 2016-10-21 ENCOUNTER — Encounter: Payer: Self-pay | Admitting: Family Medicine

## 2016-10-30 DIAGNOSIS — H01009 Unspecified blepharitis unspecified eye, unspecified eyelid: Secondary | ICD-10-CM | POA: Diagnosis not present

## 2016-10-30 DIAGNOSIS — H04123 Dry eye syndrome of bilateral lacrimal glands: Secondary | ICD-10-CM | POA: Diagnosis not present

## 2016-10-30 MED FILL — FLUOROMETHOLONE 0.1% DROPS: 0.1 | 50 days supply | Qty: 15 | Fill #0

## 2016-10-30 MED FILL — DOXYCYCLINE HYC 50 MG CAP: 50 | 20 days supply | Qty: 40 | Fill #0

## 2016-10-30 MED FILL — RESTASIS MULTIDOSE 0.05% EY: 0.05 | 90 days supply | Qty: 17 | Fill #0

## 2016-11-26 DIAGNOSIS — F524 Premature ejaculation: Secondary | ICD-10-CM | POA: Diagnosis not present

## 2016-11-26 DIAGNOSIS — Z125 Encounter for screening for malignant neoplasm of prostate: Secondary | ICD-10-CM | POA: Diagnosis not present

## 2016-12-11 DIAGNOSIS — H02886 Meibomian gland dysfunction of left eye, unspecified eyelid: Secondary | ICD-10-CM | POA: Diagnosis not present

## 2016-12-11 DIAGNOSIS — H02883 Meibomian gland dysfunction of right eye, unspecified eyelid: Secondary | ICD-10-CM | POA: Diagnosis not present

## 2016-12-11 DIAGNOSIS — H04123 Dry eye syndrome of bilateral lacrimal glands: Secondary | ICD-10-CM | POA: Diagnosis not present

## 2016-12-18 ENCOUNTER — Encounter: Payer: Self-pay | Admitting: Family Medicine

## 2016-12-18 ENCOUNTER — Ambulatory Visit (INDEPENDENT_AMBULATORY_CARE_PROVIDER_SITE_OTHER): Payer: 59 | Admitting: Family Medicine

## 2016-12-18 VITALS — BP 132/90 | HR 91 | Resp 18 | Wt 241.0 lb

## 2016-12-18 DIAGNOSIS — R7989 Other specified abnormal findings of blood chemistry: Secondary | ICD-10-CM

## 2016-12-18 DIAGNOSIS — R030 Elevated blood-pressure reading, without diagnosis of hypertension: Secondary | ICD-10-CM

## 2016-12-18 NOTE — Progress Notes (Signed)
   Subjective:    Patient ID: Matthew Daniel, male    DOB: 09-09-1965, 51 y.o.   MRN: [id number]  HPI He is here for recheck on his blood pressure. He has started a diet and exercise program and has lost several pounds. He also has some lesions in his groin area that he would like removed. He also states that he has had a long history of slightly elevated creatinine. His most recent blood work did show slight worsening of that.   Review of Systems     Objective:   Physical Exam Alert and in no distress. Blood pressure is recorded.       Assessment & Plan:  Elevated blood pressure reading  Elevated serum creatinine  His pressure is better but not at goal. Encouraged him to continue with his diet and exercise regimen and we will recheck this in 2 months. At that time we can remove the skin tags in the inguinal area. I will also readdress the creatinine at that point and possibly do further testing and consider referral to renal.

## 2017-02-12 ENCOUNTER — Telehealth: Payer: Self-pay

## 2017-02-12 ENCOUNTER — Ambulatory Visit: Payer: 59 | Admitting: Family Medicine

## 2017-02-12 NOTE — Telephone Encounter (Signed)
Attempted call to pt- unable to leave VCM in regards to missed appt.

## 2017-02-13 NOTE — Telephone Encounter (Signed)
No show letter not sent phone tree system down

## 2017-02-27 ENCOUNTER — Encounter: Payer: Self-pay | Admitting: Family Medicine

## 2017-02-27 ENCOUNTER — Ambulatory Visit: Payer: 59 | Admitting: Family Medicine

## 2017-02-27 VITALS — BP 146/90 | HR 69 | Wt 243.8 lb

## 2017-02-27 DIAGNOSIS — R030 Elevated blood-pressure reading, without diagnosis of hypertension: Secondary | ICD-10-CM

## 2017-02-27 NOTE — Progress Notes (Signed)
   Subjective:    Patient ID: Matthew Daniel, male    DOB: 10-Dec-1965, 52 y.o.   MRN: [id number]  HPI He is here for a blood pressure recheck.  He states he has checked his pressure at home and is getting much better readings in here.  He has not had his cuff measured against ours however.  He also was a concern over a lesion on his chest.   Review of Systems     Objective:   Physical Exam Alert and in no distress.  Exam of his anterior chest shows no abnormal findings.  He is actually feeling his xiphoid process.       Assessment & Plan:  Elevated blood pressure reading Discussed diet and exercise with him.  He is also to bring his blood pressure cuff in and measure it against ours and decide at that point whether therapy is needed.

## 2017-06-12 ENCOUNTER — Telehealth: Payer: Self-pay

## 2017-06-12 ENCOUNTER — Encounter: Payer: Self-pay | Admitting: Family Medicine

## 2017-06-12 ENCOUNTER — Ambulatory Visit: Payer: 59 | Admitting: Family Medicine

## 2017-06-12 VITALS — BP 146/88 | HR 80 | Temp 98.1°F | Ht 73.0 in | Wt 242.8 lb

## 2017-06-12 DIAGNOSIS — R1030 Lower abdominal pain, unspecified: Secondary | ICD-10-CM

## 2017-06-12 DIAGNOSIS — Z8719 Personal history of other diseases of the digestive system: Secondary | ICD-10-CM

## 2017-06-12 NOTE — Progress Notes (Signed)
   Subjective:    Patient ID: Matthew Daniel, male    DOB: [birth year], 52 y.o.   MRN: [id number]  HPI Chief Complaint  Patient presents with  . other    diverticulitis, pt took lot of multi supplements on sunday and has had issues every since   He is here with complaints of a gradually improving 3 day history of bilateral lower abdominal pain. Symptoms started after he took his vitamins on an empty stomach.  States initially he had chills and severe lower abdominal spasms.  Denies having diarrhea.  Denies fever, nausea, vomiting.  Reports having normal bowel movements without blood. History of diverticulitis and states his last flare up was one year ago.   States he has to take MiraLAX on a daily basis but he did miss several doses prior to the onset of symptoms.   Pain has improved 70% since onset  Last colonoscopy one year ago with Dr. Damita Lack.    No other concerns or complaints. Denies  dizziness, chest pain, palpitations, shortness of breath, urinary symptoms.   Reviewed allergies, medications, past medical, surgical, family, and social history.    Review of Systems Pertinent positives and negatives in the history of present illness.     Objective:   Physical Exam BP (!) 146/88 (BP Location: Left Arm, Patient Position: Sitting)   Pulse 80   Temp 98.1 F (36.7 C)   Ht 6\' 1"  (1.854 m)   Wt 242 lb 12.8 oz (110.1 kg)   SpO2 98%   BMI 32.03 kg/m   Alert and in no distress. Pharyngeal area is normal. Neck is supple without adenopathy or thyromegaly. Cardiac exam shows a regular sinus rhythm without murmurs or gallops. Lungs are clear to auscultation.  Abdomen is soft, nondistended, nontender, normal bowel sounds, no palpable masses.  Skin is warm and dry, no pallor.  Normal mood and thought process.      Assessment & Plan:  Lower abdominal pain  History of colonic diverticulitis  His symptoms are at least 70% improved.  Exam is unremarkable.  No obvious infectious  process.  Plan to have him continue on his current medication regimen and add a probiotic.  If he is not back to baseline I next week or if his symptoms worsen he will follow-up.

## 2017-06-12 NOTE — Telephone Encounter (Signed)
Coming in to day at 2:45 [facility] 06-12-17

## 2017-06-12 NOTE — Patient Instructions (Signed)
Your symptoms appear to be resolving. I recommend adding a probiotic and keeping an eye on your symptoms.   Follow up if you are not back to baseline next week or if your symptoms worsen.

## 2017-06-12 NOTE — Telephone Encounter (Signed)
Pt is having issues with diverticulosis. He is in a lot of pain. Please advise if something can be sent in for him.

## 2017-06-12 NOTE — Telephone Encounter (Signed)
Needs an appt

## 2017-07-09 DIAGNOSIS — R109 Unspecified abdominal pain: Secondary | ICD-10-CM | POA: Diagnosis not present

## 2017-07-09 DIAGNOSIS — K921 Melena: Secondary | ICD-10-CM | POA: Diagnosis not present

## 2017-09-18 DIAGNOSIS — B353 Tinea pedis: Secondary | ICD-10-CM | POA: Diagnosis not present

## 2017-09-18 DIAGNOSIS — B351 Tinea unguium: Secondary | ICD-10-CM | POA: Diagnosis not present

## 2017-09-18 MED FILL — TERBINAFINE HCL 250 MG TAB: 250 | 30 days supply | Qty: 30 | Fill #0 | Status: TO

## 2017-10-01 DIAGNOSIS — N183 Chronic kidney disease, stage 3 (moderate): Secondary | ICD-10-CM | POA: Diagnosis not present

## 2017-10-01 DIAGNOSIS — R030 Elevated blood-pressure reading, without diagnosis of hypertension: Secondary | ICD-10-CM | POA: Diagnosis not present

## 2017-10-02 ENCOUNTER — Other Ambulatory Visit: Payer: Self-pay | Admitting: Nephrology

## 2017-10-02 DIAGNOSIS — R030 Elevated blood-pressure reading, without diagnosis of hypertension: Secondary | ICD-10-CM

## 2017-10-02 DIAGNOSIS — N183 Chronic kidney disease, stage 3 unspecified: Secondary | ICD-10-CM

## 2017-10-06 MED FILL — VIT D2 1.25 MG (50,000 UNIT: 1.25 MG | 56 days supply | Qty: 8 | Fill #0

## 2017-10-08 ENCOUNTER — Ambulatory Visit
Admission: RE | Admit: 2017-10-08 | Discharge: 2017-10-08 | Disposition: A | Discharge status: Home / Self Care | Payer: 59 | Source: Ambulatory Visit | Attending: Nephrology | Admitting: Nephrology

## 2017-10-08 DIAGNOSIS — N183 Chronic kidney disease, stage 3 unspecified: Secondary | ICD-10-CM | POA: Insufficient documentation

## 2017-10-08 DIAGNOSIS — R030 Elevated blood-pressure reading, without diagnosis of hypertension: Secondary | ICD-10-CM

## 2017-10-08 DIAGNOSIS — N281 Cyst of kidney, acquired: Secondary | ICD-10-CM | POA: Diagnosis not present

## 2017-10-20 MED FILL — TERBINAFINE HCL 250 MG TAB: 250 | 30 days supply | Qty: 30 | Fill #0

## 2017-11-03 MED FILL — FLUOROMETHOLONE 0.1% DROPS: 0.1 | 25 days supply | Qty: 5 | Fill #0

## 2017-11-20 MED FILL — TERBINAFINE HCL 250 MG TAB: 250 | 30 days supply | Qty: 30 | Fill #1

## 2017-12-03 DIAGNOSIS — R030 Elevated blood-pressure reading, without diagnosis of hypertension: Secondary | ICD-10-CM | POA: Diagnosis not present

## 2017-12-03 DIAGNOSIS — N183 Chronic kidney disease, stage 3 (moderate): Secondary | ICD-10-CM | POA: Diagnosis not present

## 2017-12-03 DIAGNOSIS — E559 Vitamin D deficiency, unspecified: Secondary | ICD-10-CM | POA: Diagnosis not present

## 2018-01-01 DIAGNOSIS — D72820 Lymphocytosis (symptomatic): Secondary | ICD-10-CM | POA: Diagnosis not present

## 2018-01-01 DIAGNOSIS — R899 Unspecified abnormal finding in specimens from other organs, systems and tissues: Secondary | ICD-10-CM | POA: Diagnosis not present

## 2018-03-04 ENCOUNTER — Ambulatory Visit: Payer: 59 | Admitting: Family Medicine

## 2018-03-04 ENCOUNTER — Encounter: Payer: Self-pay | Admitting: Family Medicine

## 2018-03-04 VITALS — BP 128/84 | HR 71 | Temp 97.9°F | Ht 72.0 in | Wt 245.2 lb

## 2018-03-04 DIAGNOSIS — Z0000 Encounter for general adult medical examination without abnormal findings: Secondary | ICD-10-CM

## 2018-03-04 DIAGNOSIS — Z8719 Personal history of other diseases of the digestive system: Secondary | ICD-10-CM | POA: Diagnosis not present

## 2018-03-04 DIAGNOSIS — R7989 Other specified abnormal findings of blood chemistry: Secondary | ICD-10-CM

## 2018-03-04 DIAGNOSIS — R030 Elevated blood-pressure reading, without diagnosis of hypertension: Secondary | ICD-10-CM | POA: Diagnosis not present

## 2018-03-04 DIAGNOSIS — K5790 Diverticulosis of intestine, part unspecified, without perforation or abscess without bleeding: Secondary | ICD-10-CM | POA: Diagnosis not present

## 2018-03-04 DIAGNOSIS — Z87442 Personal history of urinary calculi: Secondary | ICD-10-CM | POA: Diagnosis not present

## 2018-03-04 DIAGNOSIS — F5102 Adjustment insomnia: Secondary | ICD-10-CM

## 2018-03-04 LAB — POCT URINALYSIS DIP (PROADVANTAGE DEVICE)
Bilirubin, UA: NEGATIVE
Blood, UA: NEGATIVE
Glucose, UA: NEGATIVE mg/dL
Ketones, POC UA: NEGATIVE mg/dL
Leukocytes, UA: NEGATIVE
Nitrite, UA: NEGATIVE
Protein Ur, POC: NEGATIVE mg/dL
Specific Gravity, Urine: 1.03
Urobilinogen, Ur: 3.5
pH, UA: 6 (ref 5.0–8.0)

## 2018-03-04 MED ORDER — ZOLPIDEM TARTRATE 10 MG PO TABS: 10.0000 mg | ORAL_TABLET | Freq: Every evening | ORAL | 1 refills | Status: DC | PRN

## 2018-03-04 MED ORDER — HYDROCODONE-ACETAMINOPHEN 5-325 MG PO TABS: 1.0000 | ORAL_TABLET | Freq: Four times a day (QID) | ORAL | 0 refills | Status: DC | PRN

## 2018-03-04 MED FILL — HYDROCODON-APAP 5-325: 5-325 | 3 days supply | Qty: 12 | Fill #0

## 2018-03-04 MED FILL — ZOLPIDEM TARTRATE 10 MG TAB: 10 | 30 days supply | Qty: 30 | Fill #0

## 2018-03-04 NOTE — Patient Instructions (Signed)
Cut back on carbohydrates

## 2018-03-04 NOTE — Progress Notes (Signed)
Subjective:    Patient ID: Matthew Daniel, male    DOB: 25-Feb-1965, 53 y.o.   MRN: [id number]  HPI He is here for complete examination.  He does have a history of elevated creatinine as well as slightly elevated blood pressure.  He has been seen and continues to be followed by renal for this.  Presently he is not on any medication for it.  He also has a previous history of diverticulosis as well as diverticulitis.  Apparently in 2013 he did have a colonic polyp however I do not have the pathology report.  He had a repeat in 2018 which was apparently normal.  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  He would like some medication to help with that.  He does not smoke or drink and presently is not sexually active.  Family and social history as well as health maintenance and immunizations was reviewed.   Review of Systems  All other systems reviewed and are negative.      Objective:   Physical Exam BP 128/84 (BP Location: Left Arm, Patient Position: Sitting)   Pulse 71   Temp 97.9 F (36.6 C)   Ht 6' (1.829 m)   Wt 245 lb 3.2 oz (111.2 kg)   SpO2 98%   BMI 33.26 kg/m   General Appearance:    Alert, cooperative, no distress, appears stated age  Head:    Normocephalic, without obvious abnormality, atraumatic  Eyes:    PERRL, conjunctiva/corneas clear, EOM's intact, fundi    benign  Ears:    Normal TM's and external ear canals  Nose:   Nares normal, mucosa normal, no drainage or sinus   tenderness  Throat:   Lips, mucosa, and tongue normal; teeth and gums normal  Neck:   Supple, no lymphadenopathy;  thyroid:  no   enlargement/tenderness/nodules; no carotid   bruit or JVD  Back:    Spine nontender, no curvature, ROM normal, no CVA     tenderness  Lungs:     Clear to auscultation bilaterally without wheezes, rales or     ronchi; respirations unlabored      Heart:    Regular rate and rhythm, S1 and S2 normal, no murmur,  rub   or gallop  Breast Exam:    No chest wall tenderness, masses or gynecomastia  Abdomen:     Soft, non-tender, nondistended, normoactive bowel sounds,    no masses, no hepatosplenomegaly  Genitalia:    Normal male external genitalia without lesions.  Testicles without masses.  No inguinal hernias.  Rectal:   Deferred  Extremities:   No clubbing, cyanosis or edema  Pulses:   2+ and symmetric all extremities  Skin:   Skin color, texture, turgor normal, no rashes or lesions  Lymph nodes:   Cervical, supraclavicular, and axillary nodes normal  Neurologic:   CNII-XII intact, normal strength, sensation and gait; reflexes 2+ and symmetric throughout          Psych:   Normal mood, affect, hygiene and grooming.         Assessment & Plan:  Routine general medical examination at a health care facility - Plan: CBC with Differential/Platelet, Comprehensive metabolic panel, Lipid panel, POCT Urinalysis DIP (Proadvantage Device)  Elevated blood pressure reading - Plan: CBC with Differential/Platelet, Comprehensive metabolic panel  Elevated serum creatinine - Plan: CBC with Differential/Platelet, Comprehensive metabolic panel  History of renal stone - Plan: HYDROcodone-acetaminophen (NORCO) 5-325  MG tablet  Adjustment insomnia - Plan: zolpidem (AMBIEN) 10 MG tablet  History of diverticulitis  History of colonic diverticulitis  Diverticulosis He will continue to be followed by renal.  At present time we will not place him on any medication.  Did recommend continuing with his exercise regimen and cut back on his carbohydrates. I will give him a prescription for codeine to be used when he has potential stone.  He will also use Ambien on an as-needed basis for his sleep disturbance

## 2018-03-05 LAB — CBC WITH DIFFERENTIAL/PLATELET
Basophils Absolute: 0 10*3/uL (ref 0.0–0.2)
Basos: 1 %
EOS (ABSOLUTE): 0.1 10*3/uL (ref 0.0–0.4)
Eos: 2 %
Hematocrit: 39.1 % (ref 37.5–51.0)
Hemoglobin: 13.3 g/dL (ref 13.0–17.7)
Immature Grans (Abs): 0 10*3/uL (ref 0.0–0.1)
Immature Granulocytes: 0 %
Lymphocytes Absolute: 1.6 10*3/uL (ref 0.7–3.1)
Lymphs: 51 %
MCH: 30.5 pg (ref 26.6–33.0)
MCHC: 34 g/dL (ref 31.5–35.7)
MCV: 90 fL (ref 79–97)
Monocytes Absolute: 0.5 10*3/uL (ref 0.1–0.9)
Monocytes: 15 %
Neutrophils Absolute: 1 10*3/uL — ABNORMAL LOW (ref 1.4–7.0)
Neutrophils: 31 %
Platelets: 193 10*3/uL (ref 150–450)
RBC: 4.36 x10E6/uL (ref 4.14–5.80)
RDW: 13.7 % (ref 11.6–15.4)
WBC: 3.1 10*3/uL — ABNORMAL LOW (ref 3.4–10.8)

## 2018-03-05 LAB — LIPID PANEL
Chol/HDL Ratio: 3.2 ratio (ref 0.0–5.0)
Cholesterol, Total: 159 mg/dL (ref 100–199)
HDL: 49 mg/dL (ref >39)
LDL Calculated: 100 mg/dL — ABNORMAL HIGH (ref 0–99)
Triglycerides: 49 mg/dL (ref 0–149)
VLDL Cholesterol Cal: 10 mg/dL (ref 5–40)

## 2018-03-05 LAB — COMPREHENSIVE METABOLIC PANEL
ALT: 25 IU/L (ref 0–44)
AST: 20 IU/L (ref 0–40)
Albumin/Globulin Ratio: 1.9 (ref 1.2–2.2)
Albumin: 4.2 g/dL (ref 3.8–4.9)
Alkaline Phosphatase: 58 IU/L (ref 39–117)
BUN/Creatinine Ratio: 9 (ref 9–20)
BUN: 12 mg/dL (ref 6–24)
Bilirubin Total: 0.4 mg/dL (ref 0.0–1.2)
CO2: 22 mmol/L (ref 20–29)
Calcium: 8.9 mg/dL (ref 8.7–10.2)
Chloride: 105 mmol/L (ref 96–106)
Creatinine, Ser: 1.41 mg/dL — ABNORMAL HIGH (ref 0.76–1.27)
GFR calc Af Amer: 66 mL/min/{1.73_m2} (ref >59)
GFR calc non Af Amer: 57 mL/min/{1.73_m2} — ABNORMAL LOW (ref >59)
Globulin, Total: 2.2 g/dL (ref 1.5–4.5)
Glucose: 100 mg/dL — ABNORMAL HIGH (ref 65–99)
Potassium: 4.5 mmol/L (ref 3.5–5.2)
Sodium: 143 mmol/L (ref 134–144)
Total Protein: 6.4 g/dL (ref 6.0–8.5)

## 2018-03-08 ENCOUNTER — Telehealth: Payer: Self-pay | Admitting: Family Medicine

## 2018-03-08 NOTE — Telephone Encounter (Signed)
Requested records from [facility] received. Sending back for review.

## 2018-03-10 DIAGNOSIS — D126 Benign neoplasm of colon, unspecified: Secondary | ICD-10-CM | POA: Insufficient documentation

## 2018-09-17 DIAGNOSIS — M2242 Chondromalacia patellae, left knee: Secondary | ICD-10-CM | POA: Diagnosis not present

## 2018-09-17 DIAGNOSIS — M238X2 Other internal derangements of left knee: Secondary | ICD-10-CM | POA: Diagnosis not present

## 2018-09-17 DIAGNOSIS — M25562 Pain in left knee: Secondary | ICD-10-CM | POA: Diagnosis not present

## 2018-09-20 MED FILL — AMOX-CLAV 875-125 MG TABLET: 875-125 | 7 days supply | Qty: 14 | Fill #0

## 2018-10-29 DIAGNOSIS — E559 Vitamin D deficiency, unspecified: Secondary | ICD-10-CM | POA: Diagnosis not present

## 2018-10-29 DIAGNOSIS — N183 Chronic kidney disease, stage 3 (moderate): Secondary | ICD-10-CM | POA: Diagnosis not present

## 2018-10-29 DIAGNOSIS — R030 Elevated blood-pressure reading, without diagnosis of hypertension: Secondary | ICD-10-CM | POA: Diagnosis not present

## 2018-12-02 ENCOUNTER — Other Ambulatory Visit: Payer: Self-pay | Admitting: Family Medicine

## 2018-12-02 DIAGNOSIS — F5102 Adjustment insomnia: Secondary | ICD-10-CM

## 2018-12-02 NOTE — Telephone Encounter (Signed)
Is this okay to refill? 

## 2018-12-04 MED FILL — ZOLPIDEM TARTRATE 10 MG TAB: 10 | 30 days supply | Qty: 30 | Fill #0

## 2018-12-23 DIAGNOSIS — H5211 Myopia, right eye: Secondary | ICD-10-CM | POA: Diagnosis not present

## 2018-12-23 DIAGNOSIS — H524 Presbyopia: Secondary | ICD-10-CM | POA: Diagnosis not present

## 2018-12-23 DIAGNOSIS — H5202 Hypermetropia, left eye: Secondary | ICD-10-CM | POA: Diagnosis not present

## 2019-01-17 ENCOUNTER — Encounter: Payer: Self-pay | Admitting: Family Medicine

## 2019-01-17 ENCOUNTER — Other Ambulatory Visit: Payer: Self-pay

## 2019-01-17 ENCOUNTER — Ambulatory Visit (HOSPITAL_COMMUNITY)
Admission: RE | Admit: 2019-01-17 | Discharge: 2019-01-17 | Disposition: A | Discharge status: Home / Self Care | Payer: 59 | Source: Ambulatory Visit | Attending: Family Medicine | Admitting: Family Medicine

## 2019-01-17 ENCOUNTER — Ambulatory Visit: Payer: 59 | Admitting: Family Medicine

## 2019-01-17 VITALS — BP 150/92 | HR 71 | Temp 98.3°F | Wt 243.6 lb

## 2019-01-17 DIAGNOSIS — M5441 Lumbago with sciatica, right side: Secondary | ICD-10-CM | POA: Insufficient documentation

## 2019-01-17 DIAGNOSIS — M545 Low back pain: Secondary | ICD-10-CM | POA: Diagnosis not present

## 2019-01-17 MED ORDER — HYDROCODONE-ACETAMINOPHEN 5-325 MG PO TABS: 1.0000 | ORAL_TABLET | Freq: Four times a day (QID) | ORAL | 0 refills | Status: DC | PRN

## 2019-01-17 MED ORDER — PREDNISONE 10 MG (48) PO TBPK: ORAL_TABLET | ORAL | 0 refills | Status: DC

## 2019-01-17 MED FILL — predniSONE 10 MG (48) TBPK: 10 | 12 days supply | Qty: 48 | Fill #0

## 2019-01-17 MED FILL — HYDROCODON-APAP 5-325: 5-325 | 5 days supply | Qty: 20 | Fill #0

## 2019-01-17 NOTE — Progress Notes (Signed)
   Subjective:    Patient ID: Matthew Daniel, male    DOB: Aug 29, 1965, 53 y.o.   MRN: [id number]  HPI He states that 3 weeks ago he noted the onset of right sided low back pain with radiation down to his great toe.  He noted no weakness, bowel or bladder trouble.  This did quiet down but several days ago the pain reoccurred and he rates it an 8 out of 10.  He did take a few ibuprofen but is scared because of his renal function.   Review of Systems     Objective:   Physical Exam Alert complaining of back pain.  Normal lumbar motion curve and motion.  No tenderness over the spine or SI joint.  Normal hip motion.  Straight leg raising positive with positive sciatic stretch at 45.  Strength and reflexes are normal.      Assessment & Plan:  Acute right-sided low back pain with right-sided sciatica - Plan: predniSONE (STERAPRED UNI-PAK 48 TAB) 10 MG (48) TBPK tablet, DG Lumbar Spine Complete, HYDROcodone-acetaminophen (NORCO) 5-325 MG tablet I explained that we need to get x-rays and have him use Tylenol for pain relief and codeine if he needs to.  He is to return here in 2 weeks for recheck.  Explained that above age 33 with a relatively new onset of back pain, we need to be very careful.

## 2019-01-17 NOTE — Patient Instructions (Signed)
2 Tylenol 4 times a day for the pain.  Take the full dosing of the steroid.  Use the codeine as needed especially at night

## 2019-01-23 MED FILL — ZOLPIDEM TARTRATE 10 MG TAB: 10 | 30 days supply | Qty: 30 | Fill #1

## 2019-01-25 ENCOUNTER — Ambulatory Visit: Payer: 59 | Admitting: Family Medicine

## 2019-01-25 ENCOUNTER — Encounter: Payer: Self-pay | Admitting: Family Medicine

## 2019-01-25 ENCOUNTER — Other Ambulatory Visit: Payer: Self-pay

## 2019-01-25 VITALS — BP 154/98 | HR 73 | Temp 97.8°F | Wt 244.6 lb

## 2019-01-25 DIAGNOSIS — M5441 Lumbago with sciatica, right side: Secondary | ICD-10-CM

## 2019-01-25 DIAGNOSIS — R030 Elevated blood-pressure reading, without diagnosis of hypertension: Secondary | ICD-10-CM

## 2019-01-25 NOTE — Progress Notes (Signed)
   Subjective:    Patient ID: Matthew Daniel, male    DOB: [birth year], 53 y.o.   MRN: [id number]  HPI He is here for a follow-up visit.  He continues to have pain but is using the pain medicine very sparingly.  He also is here for recheck on his blood pressure.  He states that last night he did have urinary frequency.  He is concerned about what this might indicate.   Review of Systems     Objective:   Physical Exam Alert and in no distress.  Observed getting out of a chair with a normal motion.  DTRs are normal.  Normal sensation.  Straight leg raising is now positive at approximately 60 degrees on the right.  Blood pressure is recorded.       Assessment & Plan:  Acute right-sided low back pain with right-sided sciatica  Elevated blood pressure reading I explained that we need to give it more time to lift this and quiet down.  If his symptoms get worse, he will call me otherwise I would like to wait a month.  We will also recheck his blood pressure at that time too. Aapparently he does have an exam scheduled for then

## 2019-01-31 ENCOUNTER — Ambulatory Visit: Payer: 59 | Admitting: Family Medicine

## 2019-02-16 DIAGNOSIS — K5901 Slow transit constipation: Secondary | ICD-10-CM | POA: Diagnosis not present

## 2019-02-16 DIAGNOSIS — K921 Melena: Secondary | ICD-10-CM | POA: Diagnosis not present

## 2019-02-16 DIAGNOSIS — K5730 Diverticulosis of large intestine without perforation or abscess without bleeding: Secondary | ICD-10-CM | POA: Diagnosis not present

## 2019-03-02 ENCOUNTER — Other Ambulatory Visit: Payer: Self-pay | Admitting: Family Medicine

## 2019-03-02 DIAGNOSIS — F5102 Adjustment insomnia: Secondary | ICD-10-CM

## 2019-03-02 MED FILL — ZOLPIDEM TARTRATE 10 MG TAB: 10 | 30 days supply | Qty: 30 | Fill #0

## 2019-03-02 NOTE — Telephone Encounter (Signed)
[facility] is requesting to fill pt ambien. Please advise [facility]

## 2019-03-10 ENCOUNTER — Encounter: Payer: 59 | Admitting: Family Medicine

## 2019-03-17 ENCOUNTER — Encounter: Payer: 59 | Admitting: Family Medicine

## 2019-03-23 ENCOUNTER — Emergency Department (HOSPITAL_COMMUNITY): Payer: 59

## 2019-03-23 ENCOUNTER — Inpatient Hospital Stay (HOSPITAL_COMMUNITY)
Admission: EM | Admit: 2019-03-23 | Discharge: 2019-03-25 | DRG: 392 | Disposition: A | Discharge status: Home / Self Care | Payer: 59 | Attending: General Surgery | Admitting: General Surgery

## 2019-03-23 ENCOUNTER — Encounter (HOSPITAL_COMMUNITY): Payer: Self-pay | Admitting: Emergency Medicine

## 2019-03-23 DIAGNOSIS — K5780 Diverticulitis of intestine, part unspecified, with perforation and abscess without bleeding: Secondary | ICD-10-CM

## 2019-03-23 DIAGNOSIS — Z885 Allergy status to narcotic agent status: Secondary | ICD-10-CM

## 2019-03-23 DIAGNOSIS — K5720 Diverticulitis of large intestine with perforation and abscess without bleeding: Principal | ICD-10-CM | POA: Diagnosis present

## 2019-03-23 DIAGNOSIS — K5732 Diverticulitis of large intestine without perforation or abscess without bleeding: Secondary | ICD-10-CM | POA: Diagnosis present

## 2019-03-23 DIAGNOSIS — R000 Tachycardia, unspecified: Secondary | ICD-10-CM | POA: Diagnosis present

## 2019-03-23 DIAGNOSIS — N183 Chronic kidney disease, stage 3 unspecified: Secondary | ICD-10-CM | POA: Diagnosis present

## 2019-03-23 DIAGNOSIS — R109 Unspecified abdominal pain: Secondary | ICD-10-CM | POA: Diagnosis not present

## 2019-03-23 DIAGNOSIS — Z86010 Personal history of colonic polyps: Secondary | ICD-10-CM | POA: Diagnosis not present

## 2019-03-23 DIAGNOSIS — Z20822 Contact with and (suspected) exposure to covid-19: Secondary | ICD-10-CM | POA: Diagnosis present

## 2019-03-23 DIAGNOSIS — Z87442 Personal history of urinary calculi: Secondary | ICD-10-CM | POA: Diagnosis not present

## 2019-03-23 DIAGNOSIS — Z79899 Other long term (current) drug therapy: Secondary | ICD-10-CM | POA: Diagnosis not present

## 2019-03-23 DIAGNOSIS — Z882 Allergy status to sulfonamides status: Secondary | ICD-10-CM | POA: Diagnosis not present

## 2019-03-23 DIAGNOSIS — Z7982 Long term (current) use of aspirin: Secondary | ICD-10-CM

## 2019-03-23 DIAGNOSIS — Z03818 Encounter for observation for suspected exposure to other biological agents ruled out: Secondary | ICD-10-CM | POA: Diagnosis not present

## 2019-03-23 HISTORY — DX: Diverticulitis of intestine, part unspecified, without perforation or abscess without bleeding: K57.92

## 2019-03-23 LAB — URINALYSIS, ROUTINE W REFLEX MICROSCOPIC
Bacteria, UA: NONE SEEN
Bilirubin Urine: NEGATIVE
Glucose, UA: NEGATIVE mg/dL
Ketones, ur: NEGATIVE mg/dL
Leukocytes,Ua: NEGATIVE
Nitrite: NEGATIVE
Protein, ur: 30 mg/dL — AB
Specific Gravity, Urine: 1.018 (ref 1.005–1.030)
pH: 7 (ref 5.0–8.0)

## 2019-03-23 LAB — CBC WITH DIFFERENTIAL/PLATELET
Abs Immature Granulocytes: 0.03 10*3/uL (ref 0.00–0.07)
Basophils Absolute: 0 10*3/uL (ref 0.0–0.1)
Basophils Relative: 0 %
Eosinophils Absolute: 0 10*3/uL (ref 0.0–0.5)
Eosinophils Relative: 0 %
HCT: 40.8 % (ref 39.0–52.0)
Hemoglobin: 13.7 g/dL (ref 13.0–17.0)
Immature Granulocytes: 0 %
Lymphocytes Relative: 18 %
Lymphs Abs: 1.9 10*3/uL (ref 0.7–4.0)
MCH: 30.2 pg (ref 26.0–34.0)
MCHC: 33.6 g/dL (ref 30.0–36.0)
MCV: 90.1 fL (ref 80.0–100.0)
Monocytes Absolute: 1.8 10*3/uL — ABNORMAL HIGH (ref 0.1–1.0)
Monocytes Relative: 16 %
Neutro Abs: 7.3 10*3/uL (ref 1.7–7.7)
Neutrophils Relative %: 66 %
Platelets: 313 10*3/uL (ref 150–400)
RBC: 4.53 MIL/uL (ref 4.22–5.81)
RDW: 12.4 % (ref 11.5–15.5)
WBC: 11.1 10*3/uL — ABNORMAL HIGH (ref 4.0–10.5)
nRBC: 0 % (ref 0.0–0.2)

## 2019-03-23 LAB — COMPREHENSIVE METABOLIC PANEL
ALT: 29 U/L (ref 0–44)
AST: 21 U/L (ref 15–41)
Albumin: 3.8 g/dL (ref 3.5–5.0)
Alkaline Phosphatase: 53 U/L (ref 38–126)
Anion gap: 13 (ref 5–15)
BUN: 11 mg/dL (ref 6–20)
CO2: 21 mmol/L — ABNORMAL LOW (ref 22–32)
Calcium: 9 mg/dL (ref 8.9–10.3)
Chloride: 105 mmol/L (ref 98–111)
Creatinine, Ser: 1.61 mg/dL — ABNORMAL HIGH (ref 0.61–1.24)
GFR calc Af Amer: 56 mL/min — ABNORMAL LOW (ref >60)
GFR calc non Af Amer: 48 mL/min — ABNORMAL LOW (ref >60)
Glucose, Bld: 117 mg/dL — ABNORMAL HIGH (ref 70–99)
Potassium: 3.7 mmol/L (ref 3.5–5.1)
Sodium: 139 mmol/L (ref 135–145)
Total Bilirubin: 1.3 mg/dL — ABNORMAL HIGH (ref 0.3–1.2)
Total Protein: 7.4 g/dL (ref 6.5–8.1)

## 2019-03-23 LAB — RESPIRATORY PANEL BY RT PCR (FLU A&B, COVID)
Influenza A by PCR: NEGATIVE
Influenza B by PCR: NEGATIVE
SARS Coronavirus 2 by RT PCR: NEGATIVE

## 2019-03-23 LAB — LACTIC ACID, PLASMA
Lactic Acid, Venous: 0.8 mmol/L (ref 0.5–1.9)
Lactic Acid, Venous: 0.9 mmol/L (ref 0.5–1.9)

## 2019-03-23 LAB — HIV ANTIBODY (ROUTINE TESTING W REFLEX): HIV Screen 4th Generation wRfx: NONREACTIVE

## 2019-03-23 LAB — LIPASE, BLOOD: Lipase: 24 U/L (ref 11–51)

## 2019-03-23 MED ORDER — PIPERACILLIN-TAZOBACTAM 3.375 G IVPB 30 MIN
3.3750 g | Freq: Once | INTRAVENOUS | Status: DC
  Filled 2019-03-23: qty 50

## 2019-03-23 MED ORDER — PIPERACILLIN-TAZOBACTAM 3.375 G IVPB: 3.3750 g | Freq: Three times a day (TID) | INTRAVENOUS | Status: DC

## 2019-03-23 MED ORDER — ACETAMINOPHEN 650 MG RE SUPP: 650.0000 mg | Freq: Four times a day (QID) | RECTAL | Status: DC | PRN

## 2019-03-23 MED ORDER — ONDANSETRON HCL 4 MG/2ML IJ SOLN: 4.0000 mg | Freq: Four times a day (QID) | INTRAMUSCULAR | Status: DC | PRN

## 2019-03-23 MED ORDER — ENOXAPARIN SODIUM 40 MG/0.4ML ~~LOC~~ SOLN
40.0000 mg | SUBCUTANEOUS | Status: DC
  Administered 2019-03-23: 17:00:00 40 mg via SUBCUTANEOUS
  Filled 2019-03-23: qty 0.4

## 2019-03-23 MED ORDER — IOHEXOL 300 MG/ML  SOLN
100.0000 mL | Freq: Once | INTRAMUSCULAR | Status: AC | PRN
  Administered 2019-03-23: 100 mL via INTRAVENOUS

## 2019-03-23 MED ORDER — PANTOPRAZOLE SODIUM 40 MG IV SOLR
40.0000 mg | Freq: Every day | INTRAVENOUS | Status: DC
  Administered 2019-03-23 – 2019-03-24 (×2): 40 mg via INTRAVENOUS
  Filled 2019-03-23 (×2): qty 40

## 2019-03-23 MED ORDER — MORPHINE SULFATE (PF) 4 MG/ML IV SOLN
4.0000 mg | Freq: Once | INTRAVENOUS | Status: AC
  Administered 2019-03-23: 13:00:00 4 mg via INTRAVENOUS
  Filled 2019-03-23: qty 1

## 2019-03-23 MED ORDER — SODIUM CHLORIDE 0.9 % IV SOLN: INTRAVENOUS | Status: DC

## 2019-03-23 MED ORDER — SODIUM CHLORIDE 0.9 % IV BOLUS
1000.0000 mL | Freq: Once | INTRAVENOUS | Status: AC
  Administered 2019-03-23: 13:00:00 1000 mL via INTRAVENOUS

## 2019-03-23 MED ORDER — MORPHINE SULFATE (PF) 2 MG/ML IV SOLN
2.0000 mg | INTRAVENOUS | Status: DC | PRN
  Administered 2019-03-23 – 2019-03-24 (×3): 2 mg via INTRAVENOUS
  Filled 2019-03-23 (×3): qty 1

## 2019-03-23 MED ORDER — ACETAMINOPHEN 325 MG PO TABS: 650.0000 mg | ORAL_TABLET | Freq: Four times a day (QID) | ORAL | Status: DC | PRN

## 2019-03-23 MED ORDER — TRAMADOL HCL 50 MG PO TABS: 50.0000 mg | ORAL_TABLET | Freq: Four times a day (QID) | ORAL | Status: DC | PRN

## 2019-03-23 MED ORDER — DIPHENHYDRAMINE HCL 25 MG PO CAPS: 25.0000 mg | ORAL_CAPSULE | Freq: Four times a day (QID) | ORAL | Status: DC | PRN

## 2019-03-23 MED ORDER — DIPHENHYDRAMINE HCL 50 MG/ML IJ SOLN: 25.0000 mg | Freq: Four times a day (QID) | INTRAMUSCULAR | Status: DC | PRN

## 2019-03-23 MED ORDER — METOPROLOL TARTRATE 5 MG/5ML IV SOLN: 5.0000 mg | Freq: Four times a day (QID) | INTRAVENOUS | Status: DC | PRN

## 2019-03-23 MED ORDER — ZOLPIDEM TARTRATE 5 MG PO TABS
10.0000 mg | ORAL_TABLET | Freq: Every evening | ORAL | Status: DC | PRN
  Administered 2019-03-24: 22:00:00 10 mg via ORAL
  Filled 2019-03-23: qty 2

## 2019-03-23 MED ORDER — PIPERACILLIN-TAZOBACTAM 3.375 G IVPB
3.3750 g | Freq: Three times a day (TID) | INTRAVENOUS | Status: DC
  Administered 2019-03-23 – 2019-03-25 (×5): 3.375 g via INTRAVENOUS
  Filled 2019-03-23 (×6): qty 50

## 2019-03-23 MED ORDER — PIPERACILLIN-TAZOBACTAM 3.375 G IVPB 30 MIN
3.3750 g | Freq: Once | INTRAVENOUS | Status: AC
  Administered 2019-03-23: 3.375 g via INTRAVENOUS

## 2019-03-23 MED ORDER — ONDANSETRON 4 MG PO TBDP: 4.0000 mg | ORAL_TABLET | Freq: Four times a day (QID) | ORAL | Status: DC | PRN

## 2019-03-23 NOTE — ED Provider Notes (Signed)
[facility] EMERGENCY DEPARTMENT Provider Note   CSN: [id number] Arrival date & time: 03/23/19  1130     History No chief complaint on file.   Matthew Daniel is a 54 y.o. male presenting for evaluation of abdominal pain.  Patient states that the past week, he has been having abdominal pain.  Is mostly in the left lower quadrant, but recently started to radiate to his left side.  Patient states the pain began initially 1 week ago he had associated nausea, but this has since resolved.  He reports a history of diverticulosis and diverticulitis.  Last colonoscopy was 2 years ago with Dr. Watt Climes.  7 years ago he had a polyp removed, otherwise no abdominal surgeries or interventions.  He reports a history of CKD, no other medical problems.  As he is CKD, he has been trying not to take over-the-counter pain medications.  States he took some Tylenol 2 days ago, none recently.  He denies fatigue, weakness, chest pain, shortness of breath, urinary symptoms.  No change in pain with urination.  Patient states he has struggled to have a bowel movement over the past week.  He took mag citrate yesterday which produced a small bowel movement, but none since.  He states he does not have regular bowel movements.  He was on Linzess which helped his BMs, but ran out of the medicine recently.  HPI     Past Medical History:  Diagnosis Date  . History of kidney stones     Patient Active Problem List   Diagnosis Date Noted  . Tubular adenoma of colon 03/10/2018  . CKD (chronic kidney disease), stage III 10/08/2017  . Diverticulosis of large intestine without hemorrhage 10/16/2016  . History of renal stone 12/15/2014  . History of colonic polyps 12/15/2014  . History of hemorrhoids 12/15/2014  . Gastroesophageal reflux disease without esophagitis 12/15/2014  . History of diverticulitis 12/15/2014  . Colon polyps 11/11/2011    Past Surgical History:  Procedure Laterality Date  .  COLONOSCOPY    . EYE SURGERY    . INCISION AND DRAINAGE PERIRECTAL ABSCESS N/A 03/20/2014   Procedure: EXAM UNDER ANESTHESIA with SUPERFICIAL FISTULOTOMY WITH MARSUPIALIZATION;  Surgeon: Michael Boston, MD;  Location: [facility];  Service: General;  Laterality: N/A;  . NO PAST SURGERIES    . REFRACTIVE SURGERY         Family History  Problem Relation Age of Onset  . Cancer Mother 2       Lung    Social History   Tobacco Use  . Smoking status: Never Smoker  . Smokeless tobacco: Never Used  Substance Use Topics  . Alcohol use: No    Alcohol/week: 0.0 standard drinks  . Drug use: No    Home Medications Prior to Admission medications   Medication Sig Start Date End Date Taking? Authorizing Provider  acetaminophen (TYLENOL) 500 MG tablet Take 1,000 mg by mouth every 6 (six) hours as needed.   Yes [provider]  aspirin EC 81 MG tablet Take 81 mg by mouth daily.   Yes [provider]  cholecalciferol (VITAMIN D3) 25 MCG (1000 UNIT) tablet Take 1,000 Units by mouth daily.   Yes [provider]  GARLIC PO Take 1 capsule by mouth daily.    Yes [provider]  Multiple Vitamin (MULTIVITAMIN) tablet Take 0.5 tablets by mouth 2 (two) times daily.    Yes [provider]  Probiotic Product (ALIGN) 4 MG CAPS Take  4 mg by mouth daily.   Yes [provider]  zolpidem (AMBIEN) 10 MG tablet TAKE 1 TABLET BY MOUTH AT BEDTIME AS NEEDED FOR UP TO 30 DAYS FOR SLEEP. Patient taking differently: Take 10 mg by mouth at bedtime as needed for sleep.  03/02/19  Yes Denita Lung, MD    Allergies    Oxycodone and Sulfa antibiotics  Review of Systems   Review of Systems  Gastrointestinal: Positive for abdominal pain, constipation and nausea.  All other systems reviewed and are negative.   Physical Exam Updated Vital Signs BP (!) 156/107 (BP Location: Left Arm) Comment: Pt laughing during BP  Pulse (!) 108   Temp 98.6 F (37 C) (Oral)   Resp  16   SpO2 97%   Physical Exam Vitals and nursing note reviewed.  Constitutional:      General: He is not in acute distress.    Appearance: He is well-developed.     Comments: Appears nontoxic  HENT:     Head: Normocephalic and atraumatic.  Eyes:     Extraocular Movements: Extraocular movements intact.     Conjunctiva/sclera: Conjunctivae normal.     Pupils: Pupils are equal, round, and reactive to light.  Cardiovascular:     Rate and Rhythm: Regular rhythm. Tachycardia present.     Pulses: Normal pulses.     Comments: Mildly tachycardic around 110 Pulmonary:     Effort: Pulmonary effort is normal. No respiratory distress.     Breath sounds: Normal breath sounds. No wheezing.  Abdominal:     General: There is no distension.     Palpations: Abdomen is soft. There is no mass.     Tenderness: There is abdominal tenderness. There is no guarding or rebound.     Comments: Tenderness palpation of left lower quadrant abdomen.  No rigidity or distention.  No CVA tenderness.  Musculoskeletal:        General: Normal range of motion.     Cervical back: Normal range of motion and neck supple.  Skin:    General: Skin is warm and dry.     Capillary Refill: Capillary refill takes less than 2 seconds.  Neurological:     Mental Status: He is alert and oriented to person, place, and time.     ED Results / Procedures / Treatments   Labs (all labs ordered are listed, but only abnormal results are displayed) Labs Reviewed  COMPREHENSIVE METABOLIC PANEL - Abnormal; Notable for the following components:      Result Value   CO2 21 (*)    Glucose, Bld 117 (*)    Creatinine, Ser 1.61 (*)    Total Bilirubin 1.3 (*)    GFR calc non Af Amer 48 (*)    GFR calc Af Amer 56 (*)    All other components within normal limits  CBC WITH DIFFERENTIAL/PLATELET - Abnormal; Notable for the following components:   WBC 11.1 (*)    Monocytes Absolute 1.8 (*)    All other components within normal limits    URINALYSIS, ROUTINE W REFLEX MICROSCOPIC - Abnormal; Notable for the following components:   APPearance HAZY (*)    Hgb urine dipstick SMALL (*)    Protein, ur 30 (*)    All other components within normal limits  RESPIRATORY PANEL BY RT PCR (FLU A&B, COVID)  LIPASE, BLOOD  LACTIC ACID, PLASMA  LACTIC ACID, PLASMA    EKG None  Radiology CT ABDOMEN PELVIS W CONTRAST  Result Date:  03/23/2019 CLINICAL DATA:  Acute left lower quadrant abdominal pain. EXAM: CT ABDOMEN AND PELVIS WITH CONTRAST TECHNIQUE: Multidetector CT imaging of the abdomen and pelvis was performed using the standard protocol following bolus administration of intravenous contrast. CONTRAST:  181mL OMNIPAQUE IOHEXOL 300 MG/ML  SOLN COMPARISON:  October 14, 2012. FINDINGS: Lower chest: No acute abnormality. Hepatobiliary: No focal liver abnormality is seen. No gallstones, gallbladder wall thickening, or biliary dilatation. Pancreas: Unremarkable. No pancreatic ductal dilatation or surrounding inflammatory changes. Spleen: Normal in size without focal abnormality. Adrenals/Urinary Tract: Adrenal glands are unremarkable. Kidneys are normal, without renal calculi, focal lesion, or hydronephrosis. Bladder is unremarkable. Stomach/Bowel: The stomach and appendix are unremarkable. There is no evidence of bowel obstruction. There is an area of diverticulitis involving the splenic flexure and proximal descending colon. There also appears to be probable diverticulitis involving the sigmoid colon, with 2 probable adjacent paradiverticular abscesses, the largest measuring 2.1 x 1.1 cm. Vascular/Lymphatic: No significant vascular findings are present. No enlarged abdominal or pelvic lymph nodes. Reproductive: Prostate is unremarkable. Other: No abdominal wall hernia or abnormality. No abdominopelvic ascites. Musculoskeletal: No acute or significant osseous findings. IMPRESSION: Diverticulitis is seen involving the splenic flexure and proximal  descending colon. There also appears to be diverticulitis involving the sigmoid colon, with 2 probable adjacent paradiverticular abscesses, the largest measuring 2.1 x 1.1 cm. Electronically Signed   By: Marijo Conception M.D.   On: 03/23/2019 13:49    Procedures Procedures (including critical care time)  Medications Ordered in ED Medications  piperacillin-tazobactam (ZOSYN) IVPB 3.375 g (3.375 g Intravenous New Bag/Given 03/23/19 1430)    Followed by  piperacillin-tazobactam (ZOSYN) IVPB 3.375 g (has no administration in time range)  morphine 4 MG/ML injection 4 mg (4 mg Intravenous Given 03/23/19 1305)  sodium chloride 0.9 % bolus 1,000 mL (1,000 mLs Intravenous New Bag/Given 03/23/19 1305)  iohexol (OMNIPAQUE) 300 MG/ML solution 100 mL (100 mLs Intravenous Contrast Given 03/23/19 1338)    ED Course  I have reviewed the triage vital signs and the nursing notes.  Pertinent labs & imaging results that were available during my care of the patient were reviewed by me and considered in my medical decision making (see chart for details).    MDM Rules/Calculators/A&P                      Patient presenting for evaluation of abdominal pain.  Physical exam shows patient appears nontoxic.  He is mildly tachycardic, likely due to pain.  Tenderness palpation of left lower quadrant abdomen.  History of diverticulitis.  As such, consider acute diverticulitis today.  As patient has had gradually worsening pain, consider possible microperforations abscess.  As patient has not had normal bowel movements, also consider bowel obstruction.  Will obtain labs and CT for further evaluation.  Labs show mild leukocytosis at 11.  Otherwise reassuring.  CT shows diverticulitis with associated abscess formation.  Will consult with general surgery.  Discussed with Evette Cristal, PA-C from general surgery who states gen surgery will evaluate and admit the patient.   Final Clinical Impression(s) / ED Diagnoses Final  diagnoses:  Diverticulitis of intestine with abscess, unspecified bleeding status, unspecified part of intestinal tract    Rx / DC Orders ED Discharge Orders    None       Franchot Heidelberg, PA-C 03/23/19 1440    Isla Pence, MD 03/23/19 [phone]

## 2019-03-23 NOTE — ED Triage Notes (Signed)
Pt here from work with c/o llq abd pain ,over the last few days , pt does have a history of diverticulitis , pain is starting to radiate around to his back

## 2019-03-23 NOTE — H&P (Addendum)
Matthew Daniel [birth year]  [id number].    Requesting MD/PA-C: Franchot Heidelberg, PA-C Chief Complaint: Abdominal pain Reason for Consult: Diverticulitis with abscess formation  HPI: Matthew Daniel is a 54 y.o. male with a history of CKD and diverticulitis that presented to [facility] for abdominal pain.  Patient reports that 8 days ago he began having waxing/waning, left lower quadrant abdominal pain that describes as pressure and rated as 8/10.  He reports this felt similar to his episodes of diverticulitis in the past.  He reports that he typically has 1 bout of diverticulitis per year that is managed by Dr. Watt Climes of GI.  He normally treats his flares with bowel rest/clear liquid diet and does not require antibiotics for them.  He is never required admission for diverticulitis in the past. He reports that 7 days ago his pain changed from his typical diverticulitis pain, and began radiating to his entire left abdomen and left flank.  He reports associated chills, occasional nausea and constipation.  He tried Tylenol for symptoms without relief. He did put himself on a clear liquid diet for 3-4 days but has since resumed a normal diet.  He did take mag citrate yesterday and had a loose stool this morning. He denies any associated fever, chills, emesis, dysuria, hematuria, urinary frequency, pneumaturia, melena or hematochezia. Patient follows with Dr. Watt Climes of GI and has had a colonoscopy in 2013 in 2018 that showed diverticulosis.  He denies any previous abdominal surgeries.  No family history of colon cancer or IBD.  In the ED, patient was afebrile, mildly tachycardic and without hypotension.  WBC 11.1. Creatinine 1.61 and near patient's baseline. Ct showed Diverticulitis involving the splenic flexure, proximal descending colon and sigmoid colon, with 2 probable adjacent para-diverticular abscesses with the largest measuring 2.1 x 1.1 cm. He was started on Zosyn. General surgery was asked  to admit.   Blood thinners: None  Alcohol use: None  Tobacco use: None  Illicit drug use: None  Employment: Clinical sleep supervisor at [employer]  Lives at home by himself   ROS: Review of Systems  Constitutional: Negative for chills and fever.  HENT: Negative for congestion.   Respiratory: Negative for cough and shortness of breath.   Cardiovascular: Negative for chest pain.  Gastrointestinal: Positive for abdominal pain, constipation and nausea. Negative for blood in stool, diarrhea, melena and vomiting.  Genitourinary: Negative for dysuria, frequency and urgency.  Musculoskeletal: Negative for back pain.  Skin: Negative for rash.  Psychiatric/Behavioral: Negative for substance abuse.  All other systems reviewed and are negative.   Family History  Problem Relation Age of Onset  . Cancer Mother 78       Lung    Past Medical History:  Diagnosis Date  . History of kidney stones     Past Surgical History:  Procedure Laterality Date  . COLONOSCOPY    . EYE SURGERY    . INCISION AND DRAINAGE PERIRECTAL ABSCESS N/A 03/20/2014   Procedure: EXAM UNDER ANESTHESIA with SUPERFICIAL FISTULOTOMY WITH MARSUPIALIZATION;  Surgeon: Deloris Moger Boston, MD;  Location: [facility];  Service: General;  Laterality: N/A;  . NO PAST SURGERIES    . REFRACTIVE SURGERY      Social History:  reports that he has never smoked. He has never used smokeless tobacco. He reports that he does not drink alcohol or use drugs.  Allergies:  Allergies  Allergen Reactions  . Oxycodone Nausea And Vomiting  . Sulfa Antibiotics Hives    (  Not in a hospital admission)    Physical Exam: Blood pressure (!) 156/107, pulse (!) 108, temperature 98.6 F (37 C), temperature source Oral, resp. rate 16, SpO2 97 %. General: pleasant, WD/WN male who is laying in bed in NAD HEENT: head is normocephalic, atraumatic.  Sclera are noninjected.  PERRL.  Ears and nose without any masses or lesions.  Mouth is pink and moist.  Dentition fair Neck: Supple, trachea midline, normal phonation, no thyromegaly Heart: regular, rate, and rhythm.  Normal s1,s2. No obvious murmurs, gallops, or rubs noted.  Palpable pedal pulses bilaterally  Lungs: CTAB, no wheezes, rhonchi, or rales noted.  Respiratory effort nonlabored Abd: Soft, ND, tenderness of the suprapubic abdomen, LLQ extending into the left flank. No r/r/g. No peritonitis. +BS, no masses, hernias, or organomegaly MS: no BUE/BLE edema, calves soft and non-tender. Moves all extremities.  Skin: warm and dry with no masses, lesions, or rashes Psych: A&Ox4 with an appropriate affect Neuro: cranial nerves grossly intact, equal strength in BUE/BLE bilaterally, normal speech, though process intact  Results for orders placed or performed during the hospital encounter of 03/23/19 (from the past 48 hour(s))  Urinalysis, Routine w reflex microscopic     Status: Abnormal   Collection Time: 03/23/19 11:46 AM  Result Value Ref Range   Color, Urine YELLOW YELLOW   APPearance HAZY (A) CLEAR   Specific Gravity, Urine 1.018 1.005 - 1.030   pH 7.0 5.0 - 8.0   Glucose, UA NEGATIVE NEGATIVE mg/dL   Hgb urine dipstick SMALL (A) NEGATIVE   Bilirubin Urine NEGATIVE NEGATIVE   Ketones, ur NEGATIVE NEGATIVE mg/dL   Protein, ur 30 (A) NEGATIVE mg/dL   Nitrite NEGATIVE NEGATIVE   Leukocytes,Ua NEGATIVE NEGATIVE   RBC / HPF 0-5 0 - 5 RBC/hpf   WBC, UA 0-5 0 - 5 WBC/hpf   Bacteria, UA NONE SEEN NONE SEEN   Mucus PRESENT    Hyaline Casts, UA PRESENT    Granular Casts, UA PRESENT     Comment: Performed at [facility], [address], [place], [place] [address]  Comprehensive metabolic panel     Status: Abnormal   Collection Time: 03/23/19 11:48 AM  Result Value Ref Range   Sodium 139 135 - 145 mmol/L   Potassium 3.7 3.5 - 5.1 mmol/L   Chloride 105 98 - 111 mmol/L   CO2 21 (L) 22 - 32 mmol/L   Glucose, Bld 117 (H) 70 - 99 mg/dL   BUN 11 6 - 20 mg/dL   Creatinine, Ser 1.61 (H)  0.61 - 1.24 mg/dL   Calcium 9.0 8.9 - 10.3 mg/dL   Total Protein 7.4 6.5 - 8.1 g/dL   Albumin 3.8 3.5 - 5.0 g/dL   AST 21 15 - 41 U/L   ALT 29 0 - 44 U/L   Alkaline Phosphatase 53 38 - 126 U/L   Total Bilirubin 1.3 (H) 0.3 - 1.2 mg/dL   GFR calc non Af Amer 48 (L) >60 mL/min   GFR calc Af Amer 56 (L) >60 mL/min   Anion gap 13 5 - 15    Comment: Performed at [facility] [address], [place], [place] [address]  Lipase, blood     Status: None   Collection Time: 03/23/19 11:48 AM  Result Value Ref Range   Lipase 24 11 - 51 U/L    Comment: Performed at [facility] [address] [place],  [address]  CBC with Diff  Status: Abnormal   Collection Time: 03/23/19 11:48 AM  Result Value Ref Range   WBC 11.1 (H) 4.0 - 10.5 K/uL   RBC 4.53 4.22 - 5.81 MIL/uL   Hemoglobin 13.7 13.0 - 17.0 g/dL   HCT 40.8 39.0 - 52.0 %   MCV 90.1 80.0 - 100.0 fL   MCH 30.2 26.0 - 34.0 pg   MCHC 33.6 30.0 - 36.0 g/dL   RDW 12.4 11.5 - 15.5 %   Platelets 313 150 - 400 K/uL   nRBC 0.0 0.0 - 0.2 %   Neutrophils Relative % 66 %   Neutro Abs 7.3 1.7 - 7.7 K/uL   Lymphocytes Relative 18 %   Lymphs Abs 1.9 0.7 - 4.0 K/uL   Monocytes Relative 16 %   Monocytes Absolute 1.8 (H) 0.1 - 1.0 K/uL   Eosinophils Relative 0 %   Eosinophils Absolute 0.0 0.0 - 0.5 K/uL   Basophils Relative 0 %   Basophils Absolute 0.0 0.0 - 0.1 K/uL   Immature Granulocytes 0 %   Abs Immature Granulocytes 0.03 0.00 - 0.07 K/uL    Comment: Performed at [facility], [address] [place], [place] [address]  Lactic acid, plasma     Status: None   Collection Time: 03/23/19  2:31 PM  Result Value Ref Range   Lactic Acid, Venous 0.8 0.5 - 1.9 mmol/L    Comment: Performed at [facility] [address] [place], [place] [address]   CT ABDOMEN PELVIS W CONTRAST  Result Date: 03/23/2019 CLINICAL DATA:  Acute left lower quadrant abdominal pain. EXAM: CT ABDOMEN AND PELVIS WITH CONTRAST TECHNIQUE:  Multidetector CT imaging of the abdomen and pelvis was performed using the standard protocol following bolus administration of intravenous contrast. CONTRAST:  136mL OMNIPAQUE IOHEXOL 300 MG/ML  SOLN COMPARISON:  October 14, 2012. FINDINGS: Lower chest: No acute abnormality. Hepatobiliary: No focal liver abnormality is seen. No gallstones, gallbladder wall thickening, or biliary dilatation. Pancreas: Unremarkable. No pancreatic ductal dilatation or surrounding inflammatory changes. Spleen: Normal in size without focal abnormality. Adrenals/Urinary Tract: Adrenal glands are unremarkable. Kidneys are normal, without renal calculi, focal lesion, or hydronephrosis. Bladder is unremarkable. Stomach/Bowel: The stomach and appendix are unremarkable. There is no evidence of bowel obstruction. There is an area of diverticulitis involving the splenic flexure and proximal descending colon. There also appears to be probable diverticulitis involving the sigmoid colon, with 2 probable adjacent paradiverticular abscesses, the largest measuring 2.1 x 1.1 cm. Vascular/Lymphatic: No significant vascular findings are present. No enlarged abdominal or pelvic lymph nodes. Reproductive: Prostate is unremarkable. Other: No abdominal wall hernia or abnormality. No abdominopelvic ascites. Musculoskeletal: No acute or significant osseous findings. IMPRESSION: Diverticulitis is seen involving the splenic flexure and proximal descending colon. There also appears to be diverticulitis involving the sigmoid colon, with 2 probable adjacent paradiverticular abscesses, the largest measuring 2.1 x 1.1 cm. Electronically Signed   By: Marijo Conception M.D.   On: 03/23/2019 13:49   Anti-infectives (From admission, onward)   Start     Dose/Rate Route Frequency Ordered Stop   03/23/19 2100  piperacillin-tazobactam (ZOSYN) IVPB 3.375 g     3.375 g 12.5 mL/hr over 240 Minutes Intravenous Every 8 hours 03/23/19 1520     03/23/19 2030   piperacillin-tazobactam (ZOSYN) IVPB 3.375 g     3.375 g 12.5 mL/hr over 240 Minutes Intravenous Every 8 hours 03/23/19 1426     03/23/19 1430  piperacillin-tazobactam (ZOSYN) IVPB 3.375 g  3.375 g 100 mL/hr over 30 Minutes Intravenous  Once 03/23/19 1426 03/23/19 1500   03/23/19 1415  piperacillin-tazobactam (ZOSYN) IVPB 3.375 g  Status:  Discontinued     3.375 g 100 mL/hr over 30 Minutes Intravenous  Once 03/23/19 1403 03/23/19 1426      Assessment/Plan CKD - Cr 1.61. Baseline 1.41 (03/04/2018)  Diverticulitis w/ abscess - CT showed Diverticulitis involving the splenic flexure, proximal descending colon and sigmoid colon, with 2 probable adjacent para-diverticular abscesses with the largest measuring 2.1 x 1.1 cm.  - Admit to inpatient for IV abx, bowel rest, IVF, pain control - Abscess to small for IR drainage at this time - AM labs  - We discussed possible hospital course. We discussed conservative treatment as above. We discussed if he does not improve, he may require repeat a CT scan, IR drain, or possibly surgery.   FEN - NPO, IVF VTE - SCDs, Lovenox ID - Zosyn 2/10 >> Foley - None Follow-Up - TBD  Jillyn Ledger, [facility] 03/23/2019, 3:22 PM Please see Amion for pager number during day hours 7:00am-4:30pm

## 2019-03-23 NOTE — ED Notes (Signed)
Patient transported to CT 

## 2019-03-23 NOTE — ED Notes (Signed)
Pt transported to CT ?

## 2019-03-23 NOTE — Plan of Care (Signed)

## 2019-03-24 ENCOUNTER — Encounter (HOSPITAL_COMMUNITY): Payer: Self-pay

## 2019-03-24 ENCOUNTER — Other Ambulatory Visit: Payer: Self-pay

## 2019-03-24 LAB — BASIC METABOLIC PANEL
Anion gap: 7 (ref 5–15)
BUN: 10 mg/dL (ref 6–20)
CO2: 23 mmol/L (ref 22–32)
Calcium: 8.3 mg/dL — ABNORMAL LOW (ref 8.9–10.3)
Chloride: 108 mmol/L (ref 98–111)
Creatinine, Ser: 1.46 mg/dL — ABNORMAL HIGH (ref 0.61–1.24)
GFR calc Af Amer: >60 mL/min (ref >60)
GFR calc non Af Amer: 54 mL/min — ABNORMAL LOW (ref >60)
Glucose, Bld: 101 mg/dL — ABNORMAL HIGH (ref 70–99)
Potassium: 4.2 mmol/L (ref 3.5–5.1)
Sodium: 138 mmol/L (ref 135–145)

## 2019-03-24 LAB — CBC
HCT: 36.7 % — ABNORMAL LOW (ref 39.0–52.0)
Hemoglobin: 12.3 g/dL — ABNORMAL LOW (ref 13.0–17.0)
MCH: 30.1 pg (ref 26.0–34.0)
MCHC: 33.5 g/dL (ref 30.0–36.0)
MCV: 90 fL (ref 80.0–100.0)
Platelets: 274 10*3/uL (ref 150–400)
RBC: 4.08 MIL/uL — ABNORMAL LOW (ref 4.22–5.81)
RDW: 12.6 % (ref 11.5–15.5)
WBC: 9.3 10*3/uL (ref 4.0–10.5)
nRBC: 0 % (ref 0.0–0.2)

## 2019-03-24 NOTE — Plan of Care (Signed)
  Problem: Pain Managment: Goal: General experience of comfort will improve Outcome: Progressing   Problem: Elimination: Goal: Will not experience complications related to bowel motility Outcome: Progressing   Problem: Elimination: Goal: Will not experience complications related to urinary retention Outcome: Progressing   Problem: Nutrition: Goal: Adequate nutrition will be maintained Outcome: Progressing

## 2019-03-24 NOTE — Plan of Care (Signed)
  Problem: Safety: Goal: Ability to remain free from injury will improve Outcome: Progressing   

## 2019-03-24 NOTE — Progress Notes (Signed)
[facility] Progress Note     Subjective: CC: Lower abdominal pain The patient notes that his lower abdominal pain has improved since admission. He rates the pain at a 3/10 today. He has flatus and last BM was this morning with no diarrhea or blood present. He has some mild nausea but no vomiting. He was able to get up and walk around with mild abdominal pain. The patient denied any fever or chills.   ROS   Objective: Vital signs in last 24 hours: Temp:  [98.3 F (36.8 C)-99 F (37.2 C)] 98.3 F (36.8 C) (02/11 0829) Pulse Rate:  [78-108] 78 (02/11 0829) Resp:  [16-17] 17 (02/11 0829) BP: (134-162)/(75-107) 134/75 (02/11 0829) SpO2:  [97 %-100 %] 98 % (02/11 0829) Last BM Date: 03/23/19  Intake/Output from previous day: 02/10 0701 - 02/11 0700 In: 1500.7 [P.O.:60; I.V.:1290.4; IV Piggyback:150.3] Out: -  Intake/Output this shift: No intake/output data recorded.  PE: General: pleasant, WD, WN white male who is laying in bed in NAD HEENT: head is normocephalic, atraumatic.  Sclera are noninjected.  Ears and nose without any masses or lesions.  Heart: regular, rate, and rhythm.  Normal s1,s2. No obvious murmurs, gallops, or rubs noted.  Palpable radial  pulses bilaterally Lungs: CTAB, no wheezes, rhonchi, or rales noted.  Respiratory effort nonlabored Abd: soft, mild tenderness to palpation of left lower quadrant and suprapubic area, no peritoneal signs, ND, +BS, no masses, hernias, or organomegaly MS: all 4 extremities are symmetrical with no cyanosis, clubbing, or edema. Skin: warm and dry with no masses, lesions, or rashes Neuro: speech is normal Psych: A&Ox3 with an appropriate affect.   Lab Results:  Recent Labs    03/23/19 1148 03/24/19 0217  WBC 11.1* 9.3  HGB 13.7 12.3*  HCT 40.8 36.7*  PLT 313 274   BMET Recent Labs    03/23/19 1148 03/24/19 0217  NA 139 138  K 3.7 4.2  CL 105 108  CO2 21* 23  GLUCOSE 117* 101*  BUN 11 10  CREATININE  1.61* 1.46*  CALCIUM 9.0 8.3*   PT/INR No results for input(s): LABPROT, INR in the last 72 hours. CMP     Component Value Date/Time   NA 138 03/24/2019 0217   NA 143 03/04/2018 1045   K 4.2 03/24/2019 0217   CL 108 03/24/2019 0217   CO2 23 03/24/2019 0217   GLUCOSE 101 (H) 03/24/2019 0217   BUN 10 03/24/2019 0217   BUN 12 03/04/2018 1045   CREATININE 1.46 (H) 03/24/2019 0217   CREATININE 1.65 (H) 10/16/2016 1022   CALCIUM 8.3 (L) 03/24/2019 0217   PROT 7.4 03/23/2019 1148   PROT 6.4 03/04/2018 1045   ALBUMIN 3.8 03/23/2019 1148   ALBUMIN 4.2 03/04/2018 1045   AST 21 03/23/2019 1148   ALT 29 03/23/2019 1148   ALKPHOS 53 03/23/2019 1148   BILITOT 1.3 (H) 03/23/2019 1148   BILITOT 0.4 03/04/2018 1045   GFRNONAA 54 (L) 03/24/2019 0217   GFRNONAA 57 (L) 09/29/2013 0915   GFRAA >60 03/24/2019 0217   GFRAA 66 09/29/2013 0915   Lipase     Component Value Date/Time   LIPASE 24 03/23/2019 1148       Studies/Results: CT ABDOMEN PELVIS W CONTRAST  Result Date: 03/23/2019 CLINICAL DATA:  Acute left lower quadrant abdominal pain. EXAM: CT ABDOMEN AND PELVIS WITH CONTRAST TECHNIQUE: Multidetector CT imaging of the abdomen and pelvis was performed using the standard protocol following bolus administration of intravenous contrast.  CONTRAST:  122mL OMNIPAQUE IOHEXOL 300 MG/ML  SOLN COMPARISON:  October 14, 2012. FINDINGS: Lower chest: No acute abnormality. Hepatobiliary: No focal liver abnormality is seen. No gallstones, gallbladder wall thickening, or biliary dilatation. Pancreas: Unremarkable. No pancreatic ductal dilatation or surrounding inflammatory changes. Spleen: Normal in size without focal abnormality. Adrenals/Urinary Tract: Adrenal glands are unremarkable. Kidneys are normal, without renal calculi, focal lesion, or hydronephrosis. Bladder is unremarkable. Stomach/Bowel: The stomach and appendix are unremarkable. There is no evidence of bowel obstruction. There is an area of  diverticulitis involving the splenic flexure and proximal descending colon. There also appears to be probable diverticulitis involving the sigmoid colon, with 2 probable adjacent paradiverticular abscesses, the largest measuring 2.1 x 1.1 cm. Vascular/Lymphatic: No significant vascular findings are present. No enlarged abdominal or pelvic lymph nodes. Reproductive: Prostate is unremarkable. Other: No abdominal wall hernia or abnormality. No abdominopelvic ascites. Musculoskeletal: No acute or significant osseous findings. IMPRESSION: Diverticulitis is seen involving the splenic flexure and proximal descending colon. There also appears to be diverticulitis involving the sigmoid colon, with 2 probable adjacent paradiverticular abscesses, the largest measuring 2.1 x 1.1 cm. Electronically Signed   By: Marijo Conception M.D.   On: 03/23/2019 13:49    Anti-infectives: Anti-infectives (From admission, onward)   Start     Dose/Rate Route Frequency Ordered Stop   03/23/19 2100  piperacillin-tazobactam (ZOSYN) IVPB 3.375 g  Status:  Discontinued     3.375 g 12.5 mL/hr over 240 Minutes Intravenous Every 8 hours 03/23/19 1520 03/23/19 1542   03/23/19 2030  piperacillin-tazobactam (ZOSYN) IVPB 3.375 g     3.375 g 12.5 mL/hr over 240 Minutes Intravenous Every 8 hours 03/23/19 1426     03/23/19 1430  piperacillin-tazobactam (ZOSYN) IVPB 3.375 g     3.375 g 100 mL/hr over 30 Minutes Intravenous  Once 03/23/19 1426 03/23/19 1531   03/23/19 1415  piperacillin-tazobactam (ZOSYN) IVPB 3.375 g  Status:  Discontinued     3.375 g 100 mL/hr over 30 Minutes Intravenous  Once 03/23/19 1403 03/23/19 1426       Assessment/Plan  Complicated Diverticulitis -Continue Zosyn -WBC improved from 11.1 to 9.3 -ADAT, start on clear liquids -If improving and afebrile may transition to oral antibiotics and soft food diet tomorrow with no repeat CT scan.   VTE: SCDs ID: Zosyn day 2/10 FEN: 158mL/hr 0.9% NS, will decrease  today Follow up with colorectal after discharge  LOS: 1 day    [facility] 03/24/2019, 8:42 AM Please see Amion for pager number during day hours 7:00am-4:30pm

## 2019-03-25 MED ORDER — AMOXICILLIN-POT CLAVULANATE 875-125 MG PO TABS: 1.0000 | ORAL_TABLET | Freq: Two times a day (BID) | ORAL | 0 refills | Status: AC

## 2019-03-25 MED ORDER — PANTOPRAZOLE SODIUM 40 MG PO TBEC: 40.0000 mg | DELAYED_RELEASE_TABLET | Freq: Every day | ORAL | Status: DC

## 2019-03-25 MED FILL — AMOX-CLAV 875-125 MG TABLET: 875-125 | 10 days supply | Qty: 20 | Fill #0

## 2019-03-25 NOTE — Plan of Care (Signed)
  Problem: Pain Managment: Goal: General experience of comfort will improve Outcome: Progressing   Problem: Elimination: Goal: Will not experience complications related to bowel motility Outcome: Progressing   Problem: Nutrition: Goal: Adequate nutrition will be maintained Outcome: Progressing   Problem: Coping: Goal: Level of anxiety will decrease Outcome: Progressing   Problem: Clinical Measurements: Goal: Ability to maintain clinical measurements within normal limits will improve Outcome: Progressing

## 2019-03-25 NOTE — Discharge Instructions (Signed)
Diverticulitis  Diverticulitis is when small pockets in your large intestine (colon) get infected or swollen. This causes stomach pain and watery poop (diarrhea). These pouches are called diverticula. They form in people who have a condition called diverticulosis. Follow these instructions at home: Medicines  Take over-the-counter and prescription medicines only as told by your doctor. These include: ? Antibiotics. ? Pain medicines. ? Fiber pills. ? Probiotics. ? Stool softeners.  Do not drive or use heavy machinery while taking prescription pain medicine.  If you were prescribed an antibiotic, take it as told. Do not stop taking it even if you feel better. General instructions   Follow a diet as told by your doctor.  When you feel better, your doctor may tell you to change your diet. You may need to eat a lot of fiber. Fiber makes it easier to poop (have bowel movements). Healthy foods with fiber include: ? Berries. ? Beans. ? Lentils. ? Green vegetables.  Exercise 3 or more times a week. Aim for 30 minutes each time. Exercise enough to sweat and make your heart beat faster.  Keep all follow-up visits as told. This is important. You may need to have an exam of the large intestine. This is called a colonoscopy. Contact a doctor if:  Your pain does not get better.  You have a hard time eating or drinking.  You are not pooping like normal. Get help right away if:  Your pain gets worse.  Your problems do not get better.  Your problems get worse very fast.  You have a fever.  You throw up (vomit) more than one time.  You have poop that is: ? Bloody. ? Black. ? Tarry. Summary  Diverticulitis is when small pockets in your large intestine (colon) get infected or swollen.  Take medicines only as told by your doctor.  Follow a diet as told by your doctor. This information is not intended to replace advice given to you by your health care provider. Make sure you  discuss any questions you have with your health care provider. Document Revised: 01/09/2017 Document Reviewed: 02/14/2016 Elsevier Patient Education  2020 Elsevier Inc.   Low-Fiber Eating Plan Fiber is found in fruits, vegetables, whole grains, and beans. Eating a diet low in fiber helps to reduce how often you have bowel movements and how much you produce during a bowel movement. A low-fiber eating plan may help your digestive system heal if:  You have certain conditions, such as Crohn's disease or diverticulitis.  You recently had radiation therapy on your pelvis or bowel.  You recently had intestinal surgery.  You have a new surgical opening in your abdomen (colostomy or ileostomy).  Your intestine is narrowed (stricture). Your health care provider will determine how long you need to stay on this diet. Your health care provider may recommend that you work with a diet and nutrition specialist (dietitian). What are tips for following this plan? General guidelines  Follow recommendations from your dietitian about how much fiber you should have each day.  Most people on this eating plan should try to eat less than 10 grams (g) of fiber each day. Your daily fiber goal is _________________ g.  Take vitamin and mineral supplements as told by your health care provider or dietitian. Chewable or liquid forms are best when on this eating plan. Reading food labels  Check food labels for the amount of dietary fiber.  Choose foods that have less than 2 grams of fiber in one serving.   Cooking  Use white flour and other allowed grains for baking and cooking.  Cook meat using methods that keep it tender, such as braising or poaching.  Cook eggs until the yolk is completely solid.  Cook with healthy oils, such as olive oil or canola oil. Meal planning   Eat 5-6 small meals throughout the day instead of 3 large meals.  If you are lactose intolerant: ? Choose low-lactose dairy  foods. ? Do not eat dairy foods, if told by your dietitian.  Limit fat and oils to less than 8 teaspoons a day.  Eat small portions of desserts. What foods are allowed? The items listed below may not be a complete list. Talk with your dietitian about what dietary choices are best for you. Grains All bread and crackers made with white flour. Waffles, pancakes, and French toast. Bagels. Pretzels. Melba toast, zwieback, and matzoh. Cooked and dried cereals that do not contain whole grains, added fiber, seeds, or dried fruit. Cornmeal. Farina. Hot and cold cereals made with refined corn, wheat, rice, or oats. Plain pasta and noodles. White rice. Vegetables Well-cooked or canned vegetables without skin, seeds, or stems. Cooked potatoes without skins. Vegetable juice. Fruits Soft-cooked or canned fruits without skin and seeds. Peeled ripe banana. Applesauce. Fruit juice without pulp. Meats and other protein foods Ground meat. Tender cuts of meat or poultry. Eggs. Fish, seafood, and shellfish. Smooth nut butters. Tofu. Dairy All milk products and drinks. Lactose-free milks, including rice, soy, and almond milks. Yogurt without fruit, nuts, chocolate, or granola mix-ins. Sour cream. Cottage cheese. Cheese. Beverages Decaf coffee. Fruit and vegetable juices or smoothies (in small amounts, with no pulp or skins, and with fruits from allowed list). Sports drinks. Herbal tea. Fats and oils Olive oil, canola oil, sunflower oil, flaxseed oil, and grapeseed oil. Mayonnaise. Cream cheese. Margarine. Butter. Sweets and desserts Plain cakes and cookies. Cream pies and pies made with allowed fruits. Pudding. Custard. Fruit gelatin. Sherbet. Popsicles. Ice cream without nuts. Plain hard candy. Honey. Jelly. Molasses. Syrups, including chocolate syrup. Chocolate. Marshmallows. Gumdrops. Seasoning and other foods Bouillon. Broth. Cream soups made from allowed foods. Strained soup. Casseroles made with allowed  foods. Ketchup. Mild mustard. Mild salad dressings. Plain gravies. Vinegar. Spices in moderation. Salt. Sugar. What foods are not allowed? The items listed below may not be a complete list. Talk with your dietitian about what dietary choices are best for you. Grains Whole wheat and whole grain breads and crackers. Multigrain breads and crackers. Rye bread. Whole grain or multigrain cereals. Cereals with nuts, raisins, or coconut. Bran. Coarse wheat cereals. Granola. High-fiber cereals. Cornmeal or corn bread. Whole grain pasta. Wild or brown rice. Quinoa. Popcorn. Buckwheat. Wheat germ. Vegetables Potato skins. Raw or undercooked vegetables. All beans and bean sprouts. Cooked greens. Corn. Peas. Cabbage. Beets. Broccoli. Brussels sprouts. Cauliflower. Mushrooms. Onions. Peppers. Parsnips. Okra. Sauerkraut. Fruit Raw or dried fruit. Berries. Fruit juice with pulp. Prune juice. Meats and other protein foods Tough, fibrous meats with gristle. Fatty meat. Poultry with skin. Fried meat, poultry, or fish. Deli or lunch meats. Sausage, bacon, and hot dogs. Nuts and chunky nut butter. Dried peas, beans, and lentils. Dairy Yogurt with fruit, nuts, chocolate, or granola mix-ins. Beverages Caffeinated coffee and teas. Fats and oils Avocado. Coconut. Sweets and desserts Desserts, cookies, or candies that contain nuts or coconut. Dried fruit. Jams and preserves with seeds. Marmalade. Any dessert made with fruits or grains that are not allowed. Seasoning and other foods Corn tortilla chips.   Soups made with vegetables or grains that are not allowed. Relish. Horseradish. Pickles. Olives. Summary  Most people on a low-fiber eating plan should eat less than 10 grams of fiber a day. Follow recommendations from your dietitian about how much fiber you should have each day.  Always check food labels to see the dietary fiber content of packaged foods. In general, a low-fiber food will have fewer than 2 grams of  fiber per serving.  In general, try to avoid whole grains, raw fruits and vegetables, dried fruit, tough cuts of meat, nuts, and seeds.  Take a vitamin and mineral supplement as told by your health care provider or dietitian. This information is not intended to replace advice given to you by your health care provider. Make sure you discuss any questions you have with your health care provider. Document Revised: 05/21/2018 Document Reviewed: 04/01/2016 Elsevier Patient Education  2020 Elsevier Inc.   High-Fiber Diet Fiber, also called dietary fiber, is a type of carbohydrate that is found in fruits, vegetables, whole grains, and beans. A high-fiber diet can have many health benefits. Your health care provider may recommend a high-fiber diet to help:  Prevent constipation. Fiber can make your bowel movements more regular.  Lower your cholesterol.  Relieve the following conditions: ? Swelling of veins in the anus (hemorrhoids). ? Swelling and irritation (inflammation) of specific areas of the digestive tract (uncomplicated diverticulosis). ? A problem of the large intestine (colon) that sometimes causes pain and diarrhea (irritable bowel syndrome, IBS).  Prevent overeating as part of a weight-loss plan.  Prevent heart disease, type 2 diabetes, and certain cancers. What is my plan? The recommended daily fiber intake in grams (g) includes:  38 g for men age 50 or younger.  30 g for men over age 50.  25 g for women age 50 or younger.  21 g for women over age 50. You can get the recommended daily intake of dietary fiber by:  Eating a variety of fruits, vegetables, grains, and beans.  Taking a fiber supplement, if it is not possible to get enough fiber through your diet. What do I need to know about a high-fiber diet?  It is better to get fiber through food sources rather than from fiber supplements. There is not a lot of research about how effective supplements are.  Always  check the fiber content on the nutrition facts label of any prepackaged food. Look for foods that contain 5 g of fiber or more per serving.  Talk with a diet and nutrition specialist (dietitian) if you have questions about specific foods that are recommended or not recommended for your medical condition, especially if those foods are not listed below.  Gradually increase how much fiber you consume. If you increase your intake of dietary fiber too quickly, you may have bloating, cramping, or gas.  Drink plenty of water. Water helps you to digest fiber. What are tips for following this plan?  Eat a wide variety of high-fiber foods.  Make sure that half of the grains that you eat each day are whole grains.  Eat breads and cereals that are made with whole-grain flour instead of refined flour or white flour.  Eat brown rice, bulgur wheat, or millet instead of white rice.  Start the day with a breakfast that is high in fiber, such as a cereal that contains 5 g of fiber or more per serving.  Use beans in place of meat in soups, salads, and pasta dishes.    Eat high-fiber snacks, such as berries, raw vegetables, nuts, and popcorn.  Choose whole fruits and vegetables instead of processed forms like juice or sauce. What foods can I eat?  Fruits Berries. Pears. Apples. Oranges. Avocado. Prunes and raisins. Dried figs. Vegetables Sweet potatoes. Spinach. Kale. Artichokes. Cabbage. Broccoli. Cauliflower. Green peas. Carrots. Squash. Grains Whole-grain breads. Multigrain cereal. Oats and oatmeal. Brown rice. Barley. Bulgur wheat. Millet. Quinoa. Bran muffins. Popcorn. Rye wafer crackers. Meats and other proteins Navy, kidney, and pinto beans. Soybeans. Split peas. Lentils. Nuts and seeds. Dairy Fiber-fortified yogurt. Beverages Fiber-fortified soy milk. Fiber-fortified orange juice. Other foods Fiber bars. The items listed above may not be a complete list of recommended foods and beverages.  Contact a dietitian for more options. What foods are not recommended? Fruits Fruit juice. Cooked, strained fruit. Vegetables Fried potatoes. Canned vegetables. Well-cooked vegetables. Grains White bread. Pasta made with refined flour. White rice. Meats and other proteins Fatty cuts of meat. Fried chicken or fried fish. Dairy Milk. Yogurt. Cream cheese. Sour cream. Fats and oils Butters. Beverages Soft drinks. Other foods Cakes and pastries. The items listed above may not be a complete list of foods and beverages to avoid. Contact a dietitian for more information. Summary  Fiber is a type of carbohydrate. It is found in fruits, vegetables, whole grains, and beans.  There are many health benefits of eating a high-fiber diet, such as preventing constipation, lowering blood cholesterol, helping with weight loss, and reducing your risk of heart disease, diabetes, and certain cancers.  Gradually increase your intake of fiber. Increasing too fast can result in cramping, bloating, and gas. Drink plenty of water while you increase your fiber.  The best sources of fiber include whole fruits and vegetables, whole grains, nuts, seeds, and beans. This information is not intended to replace advice given to you by your health care provider. Make sure you discuss any questions you have with your health care provider. Document Revised: 12/01/2016 Document Reviewed: 12/01/2016 Elsevier Patient Education  2020 Elsevier Inc.  

## 2019-03-25 NOTE — Plan of Care (Signed)
Pt discharging to home. Discharge instructions explained to pt and pt verbalized understanding. Packed all personal belongings. No further questions or concerns voiced. Awaiting transportation.  Problem: Education: Goal: Knowledge of General Education information will improve Description: Including pain rating scale, medication(s)/side effects and non-pharmacologic comfort measures 03/25/2019 1040 by Stevan Born, RN Outcome: Completed/Met 03/25/2019 1039 by Stevan Born, RN Outcome: Progressing   Problem: Health Behavior/Discharge Planning: Goal: Ability to manage health-related needs will improve 03/25/2019 1040 by Stevan Born, RN Outcome: Completed/Met 03/25/2019 1039 by Stevan Born, RN Outcome: Progressing   Problem: Clinical Measurements: Goal: Ability to maintain clinical measurements within normal limits will improve Outcome: Completed/Met Goal: Will remain free from infection 03/25/2019 1040 by Stevan Born, RN Outcome: Completed/Met 03/25/2019 1039 by Stevan Born, RN Outcome: Progressing Goal: Diagnostic test results will improve Outcome: Completed/Met Goal: Respiratory complications will improve Outcome: Completed/Met Goal: Cardiovascular complication will be avoided Outcome: Completed/Met   Problem: Activity: Goal: Risk for activity intolerance will decrease Outcome: Completed/Met   Problem: Nutrition: Goal: Adequate nutrition will be maintained Outcome: Completed/Met   Problem: Coping: Goal: Level of anxiety will decrease Outcome: Completed/Met   Problem: Elimination: Goal: Will not experience complications related to bowel motility Outcome: Completed/Met Goal: Will not experience complications related to urinary retention Outcome: Completed/Met   Problem: Pain Managment: Goal: General experience of comfort will improve 03/25/2019 1040 by Stevan Born, RN Outcome: Completed/Met 03/25/2019 1039 by Stevan Born, RN Outcome:  Progressing   Problem: Safety: Goal: Ability to remain free from injury will improve 03/25/2019 1040 by Stevan Born, RN Outcome: Completed/Met 03/25/2019 1039 by Stevan Born, RN Outcome: Progressing   Problem: Skin Integrity: Goal: Risk for impaired skin integrity will decrease 03/25/2019 1040 by Stevan Born, RN Outcome: Completed/Met 03/25/2019 1039 by Stevan Born, RN Outcome: Progressing

## 2019-03-25 NOTE — Discharge Summary (Addendum)
Physician Discharge Summary  Patient ID: Matthew Daniel MRN: [id number] DOB/AGE: [birth year] 54 y.o.  Admit date: 03/23/2019 Discharge date: 03/25/2019  Admission Diagnoses: Complicated diverticulitis  Discharge Diagnoses:  Active Problems:   Sigmoid diverticulitis   Discharged Condition: good  Hospital Course: 36 yom who has history of outpatient diverticulitis cared for by Dr Watt Climes. This time he has pain, ct scan with diverticulitis and an abscess largest being 2.1 cm. He was admitted, started on zosyn and this has resolved quickly. He has no pain, wbc normal, afebrile, nontender and is tolerating diet. Will dc home with follow up  Consults: None  Significant Diagnostic Studies: ct scan: IMPRESSION: Diverticulitis is seen involving the splenic flexure and proximal descending colon. There also appears to be diverticulitis involving the sigmoid colon, with 2 probable adjacent paradiverticular abscesses, the largest measuring 2.1 x 1.1 cm.   Treatments: IV hydration and antibiotics: Zosyn  Discharge Exam: Blood pressure 133/82, pulse 76, temperature (!) 97.5 F (36.4 C), temperature source Oral, resp. rate 17, height 6' (1.829 m), weight 110.9 kg, SpO2 98 %. GI: nontender nondistended  Disposition: Discharge disposition: 01-Home or Self Care     home    Follow-up Information    Leighton Ruff, MD Follow up on 04/19/2019.   Specialty: General Surgery Why:  9:10am, arrive by 8:50am for paperwork and check in process. Contact information: [place] [facility] [place] [address] [phone]           Signed: Henreitta Cea 03/25/2019, 10:21 AM

## 2019-03-25 NOTE — Consult Note (Signed)
   [facility] CM Inpatient Consult   03/25/2019  CHANDLAR COBBLE Mar 24, 1965 [id number]   Patient is in [place] with pending status noted today. Electronic medical record reveals patient transition to home today.  Patient is listed for post hospital follow up call with a [place] for post hospital support in the plan.  Patient already transitioned home when reviewed for needs.   Plan:  [place] Coordinator to follow up.   For questions,  please contact:   Natividad Brood, RN BSN [facility] Liaison  [phone] business mobile phone Toll free office [phone]  Fax number: [phone] [email or web address]

## 2019-03-25 NOTE — Plan of Care (Signed)
  Problem: Education: Goal: Knowledge of General Education information will improve Description: Including pain rating scale, medication(s)/side effects and non-pharmacologic comfort measures Outcome: Progressing   Problem: Health Behavior/Discharge Planning: Goal: Ability to manage health-related needs will improve Outcome: Progressing   Problem: Clinical Measurements: Goal: Will remain free from infection Outcome: Progressing   Problem: Pain Managment: Goal: General experience of comfort will improve Outcome: Progressing   Problem: Safety: Goal: Ability to remain free from injury will improve Outcome: Progressing   Problem: Skin Integrity: Goal: Risk for impaired skin integrity will decrease Outcome: Progressing

## 2019-03-28 ENCOUNTER — Other Ambulatory Visit: Payer: Self-pay | Admitting: *Deleted

## 2019-03-28 NOTE — Patient Outreach (Signed)
[place] [facility]) Care Management  03/28/2019  Matthew Daniel [birth year] [id number]   Transition of care telephone call  Referral received:03/24/19 Initial outreach:03/28/19 Insurance: [facility]   Initial unsuccessful telephone call to patient's preferred number in order to complete transition of care assessment; no answer,mailbox full unable to leave a message.   Objective: Per electronic record , Matthew Daniel  was hospitalized at [facility]  from 2/10-2/12/21 for Sigmoid Diverticulitis  Comorbidities include: Diverticulitis , colon polyps,  Chronic kidney disease, renal stone. He  was discharged to home on 03/25/19 without the need for home health services or DME.     Plan: This RNCM will route unsuccessful outreach letter with [place] Management pamphlet and 24 hour Nurse Advice Line Magnet to [place] Management clinical pool to be mailed to patient's home address. This RNCM will attempt another outreach within 4 business days.   Joylene Draft, RN, BSN  [place] Management Coordinator  [phone]- Mobile [phone]- Toll Free Main Office

## 2019-03-31 ENCOUNTER — Other Ambulatory Visit: Payer: Self-pay | Admitting: *Deleted

## 2019-03-31 NOTE — Patient Outreach (Signed)
[facility] [facility]) Care Management  03/31/2019  UGO BOURN Jun 13, 1965 [id number]   Transition of care call Referral received: 03/24/19 Initial outreach attempt:2/15 /21 Insurance: [employer]   2nd unsuccessful telephone call to patient's preferred contact number in order to complete post hospital discharge transition of care assessment , no answer received message mailbox is full unable to leave a message.    Objective: Per electronic record , Mr. Matthew Daniel was hospitalized [facility]  from 2/10-2/12/21 for Sigmoid Diverticulitis  Comorbidities include: Diverticulitis , colon polyps,  Chronic kidney disease, renal stone. He  was discharged to home on 03/25/19 without the need for home health servicesor DME.   Plan If no return call from patient will attempt 3rd outreach in the next 4 business days.   Joylene Draft, RN, BSN  [place] Management Coordinator  [phone]- Mobile [phone]- Toll Free Main Office

## 2019-04-05 ENCOUNTER — Other Ambulatory Visit: Payer: Self-pay | Admitting: *Deleted

## 2019-04-05 NOTE — Patient Outreach (Signed)
[place] [facility]) Care Management  04/05/2019  ROYE RUSCO 11-16-1965 [id number]   Transition of care call Referral received: 03/24/19 Initial outreach attempt: 03/28/19 Insurance: [place] unsuccessful telephone call to patient's preferred contact number in order to complete post hospital discharge transition of care assessment; no answer, mailbox is full unable to leave a message. .   Objective: Per electronic record ,[facility]Comorbidities include: Diverticulitis , colon polyps, Chronic kidney disease, renal stone. Hewas discharged to home on 2/12/21without the need for home health servicesor DME.  Plan: If no return call from patient, will close case to [place] Management services in 10 business days after initial post hospital discharge outreach, on 03/28/19  Joylene Draft, RN, BSN  [place] Management Coordinator  [phone]- Mobile [phone]- [place] .

## 2019-04-08 ENCOUNTER — Other Ambulatory Visit: Payer: Self-pay | Admitting: *Deleted

## 2019-04-08 MED FILL — AMOX-CLAV 875-125 MG TABLET: 875-125 | 10 days supply | Qty: 20 | Fill #0

## 2019-04-08 NOTE — Patient Outreach (Signed)
[facility]) Care Management  04/08/2019  Matthew Daniel Mar 05, 1965 [id number]  Transition of care /Case Closure Unsuccessful outreach    Referral received:03/24/19 Initial outreach:03/28/19 Insurance: [place]   Unable to complete post hospital discharge transition of care assessment. No return call from patient after 3 call attempts and no response to request to contact RN Care Coordinator in unsuccessful outreach letter mailed to home on 2/15 /21.  Objective:  Per electronic record , Matthew Daniel was hospitalized [facility]  from 2/10-2/12/21 for Sigmoid Diverticulitis  Comorbidities include: Diverticulitis , colon polyps,  Chronic kidney disease, renal stone. He  was discharged to home on 03/25/19 without the need for home health servicesor DME.  Plan Case closed to [facility] [employer] as it has been 10 days since initial post discharge outreach attempt.   Joylene Draft, RN, BSN  [place] Management Coordinator  [phone]- Mobile [phone]- Toll Free Main Office

## 2019-04-12 MED FILL — ZOLPIDEM TARTRATE 10 MG TAB: 10 | 30 days supply | Qty: 30 | Fill #1

## 2019-04-14 DIAGNOSIS — K5732 Diverticulitis of large intestine without perforation or abscess without bleeding: Secondary | ICD-10-CM | POA: Diagnosis not present

## 2019-04-20 DIAGNOSIS — Z86010 Personal history of colonic polyps: Secondary | ICD-10-CM | POA: Diagnosis not present

## 2019-04-20 DIAGNOSIS — K5730 Diverticulosis of large intestine without perforation or abscess without bleeding: Secondary | ICD-10-CM | POA: Diagnosis not present

## 2019-05-18 ENCOUNTER — Encounter: Payer: Self-pay | Admitting: Family Medicine

## 2019-05-18 ENCOUNTER — Ambulatory Visit: Payer: 59 | Admitting: Family Medicine

## 2019-05-18 ENCOUNTER — Other Ambulatory Visit: Payer: Self-pay

## 2019-05-18 VITALS — BP 130/86 | HR 97 | Temp 98.2°F | Ht 72.0 in | Wt 223.4 lb

## 2019-05-18 DIAGNOSIS — N1831 Chronic kidney disease, stage 3a: Secondary | ICD-10-CM | POA: Diagnosis not present

## 2019-05-18 DIAGNOSIS — K219 Gastro-esophageal reflux disease without esophagitis: Secondary | ICD-10-CM | POA: Diagnosis not present

## 2019-05-18 DIAGNOSIS — Z8719 Personal history of other diseases of the digestive system: Secondary | ICD-10-CM | POA: Diagnosis not present

## 2019-05-18 DIAGNOSIS — K5730 Diverticulosis of large intestine without perforation or abscess without bleeding: Secondary | ICD-10-CM | POA: Diagnosis not present

## 2019-05-18 DIAGNOSIS — Z87442 Personal history of urinary calculi: Secondary | ICD-10-CM

## 2019-05-18 DIAGNOSIS — Z0000 Encounter for general adult medical examination without abnormal findings: Secondary | ICD-10-CM

## 2019-05-18 DIAGNOSIS — Z86010 Personal history of colonic polyps: Secondary | ICD-10-CM | POA: Diagnosis not present

## 2019-05-18 DIAGNOSIS — F5102 Adjustment insomnia: Secondary | ICD-10-CM | POA: Diagnosis not present

## 2019-05-18 LAB — POCT URINALYSIS DIP (PROADVANTAGE DEVICE)
Bilirubin, UA: NEGATIVE
Blood, UA: NEGATIVE
Glucose, UA: NEGATIVE mg/dL
Ketones, POC UA: NEGATIVE mg/dL
Leukocytes, UA: NEGATIVE
Nitrite, UA: NEGATIVE
Specific Gravity, Urine: 1.015
Urobilinogen, Ur: 0.2
pH, UA: 6 (ref 5.0–8.0)

## 2019-05-18 NOTE — Progress Notes (Signed)
   Subjective:    Patient ID: Matthew Daniel, male    DOB: 08/14/1965, 54 y.o.   MRN: [id number]  HPI He is here for complete examination.  He has had recent difficulty with diverticulitis and seems to be doing fairly well at this point.  He also has a history of colonic polyps and is scheduled for routine follow-up concerning that.  He was also had kidney stones in the past but none recently.  His reflux seems to be under good control.  He does have a long history of CKD dating back to his teenage years and this seems to be stable.  His work is going well.  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  Does not smoke.  Family and social history as well as health maintenance and immunizations was reviewed.   Review of Systems  All other systems reviewed and are negative.      Objective:   Physical Exam Alert and in no distress. Tympanic membranes and canals are normal. Pharyngeal area is normal. Neck is supple without adenopathy or thyromegaly. Cardiac exam shows a regular sinus rhythm without murmurs or gallops. Lungs are clear to auscultation. Abdominal exam shows no masses or tenderness. EKG was reviewed by me and does show a sinus rhythm with other parameters as recorded on the sheet.     Assessment & Plan:  Routine general medical examination at a health care facility - Plan: EKG 12-Lead, POCT Urinalysis DIP (Proadvantage Device)  Stage 3a chronic kidney disease  History of renal stone  History of colonic polyps  Gastroesophageal reflux disease without esophagitis  Diverticulosis of large intestine without hemorrhage  Adjustment insomnia  History of diverticulitis Discussed proper diet and exercise with him as best in regard to his diverticulitis.  Also discussed calling me if he has issues with kidney stones to try and help keep him from having to go to the emergency room.  He will follow-up with GI as previously scheduled.

## 2019-06-25 ENCOUNTER — Other Ambulatory Visit: Payer: Self-pay | Admitting: Family Medicine

## 2019-06-25 MED ORDER — AMOXICILLIN-POT CLAVULANATE 875-125 MG PO TABS: 1.0000 | ORAL_TABLET | Freq: Two times a day (BID) | ORAL | 0 refills | Status: DC

## 2019-06-25 NOTE — Progress Notes (Signed)
He called stating that he is having diverticulitis symptoms of abdominal pain,nausea and vomiting and wants an antibiotic. Augmentin will be called in

## 2019-11-18 ENCOUNTER — Other Ambulatory Visit (HOSPITAL_COMMUNITY): Payer: Self-pay | Admitting: Nephrology

## 2019-11-18 DIAGNOSIS — N183 Chronic kidney disease, stage 3 unspecified: Secondary | ICD-10-CM | POA: Diagnosis not present

## 2019-11-18 DIAGNOSIS — E559 Vitamin D deficiency, unspecified: Secondary | ICD-10-CM | POA: Diagnosis not present

## 2019-11-18 DIAGNOSIS — I10 Essential (primary) hypertension: Secondary | ICD-10-CM | POA: Diagnosis not present

## 2019-11-18 DIAGNOSIS — N1831 Chronic kidney disease, stage 3a: Secondary | ICD-10-CM | POA: Diagnosis not present

## 2019-11-18 MED FILL — LISINOPRIL 10 MG TABS: 10 | 90 days supply | Qty: 90 | Fill #0

## 2019-11-25 ENCOUNTER — Ambulatory Visit: Payer: 59 | Admitting: Family Medicine

## 2019-12-09 ENCOUNTER — Other Ambulatory Visit: Payer: Self-pay

## 2019-12-09 ENCOUNTER — Ambulatory Visit (INDEPENDENT_AMBULATORY_CARE_PROVIDER_SITE_OTHER): Payer: 59

## 2019-12-09 ENCOUNTER — Encounter: Payer: Self-pay | Admitting: Family Medicine

## 2019-12-09 ENCOUNTER — Ambulatory Visit (INDEPENDENT_AMBULATORY_CARE_PROVIDER_SITE_OTHER): Payer: 59 | Admitting: Family Medicine

## 2019-12-09 VITALS — BP 144/82 | HR 85 | Temp 98.5°F | Ht 73.03 in | Wt 239.9 lb

## 2019-12-09 DIAGNOSIS — G959 Disease of spinal cord, unspecified: Secondary | ICD-10-CM

## 2019-12-09 DIAGNOSIS — R1030 Lower abdominal pain, unspecified: Secondary | ICD-10-CM | POA: Diagnosis not present

## 2019-12-09 DIAGNOSIS — R319 Hematuria, unspecified: Secondary | ICD-10-CM

## 2019-12-09 DIAGNOSIS — N50812 Left testicular pain: Secondary | ICD-10-CM

## 2019-12-09 DIAGNOSIS — R102 Pelvic and perineal pain: Secondary | ICD-10-CM | POA: Diagnosis not present

## 2019-12-09 DIAGNOSIS — Z87442 Personal history of urinary calculi: Secondary | ICD-10-CM | POA: Diagnosis not present

## 2019-12-09 LAB — POCT URINALYSIS DIP (CLINITEK)
Bilirubin, UA: NEGATIVE
Glucose, UA: NEGATIVE mg/dL
Ketones, POC UA: NEGATIVE mg/dL
Leukocytes, UA: NEGATIVE
Nitrite, UA: NEGATIVE
POC PROTEIN,UA: NEGATIVE
Spec Grav, UA: 1.02 (ref 1.010–1.025)
Urobilinogen, UA: 0.2 E.U./dL
pH, UA: 6 (ref 5.0–8.0)

## 2019-12-09 NOTE — Patient Instructions (Signed)
Great to meet you today! I have placed orders for an MRI of your lower back. You should be contacted set this up.  Once we get results from this back we'll discuss options for management and treatment of this.

## 2019-12-11 DIAGNOSIS — G959 Disease of spinal cord, unspecified: Secondary | ICD-10-CM | POA: Insufficient documentation

## 2019-12-11 DIAGNOSIS — M5136 Other intervertebral disc degeneration, lumbar region: Secondary | ICD-10-CM | POA: Insufficient documentation

## 2019-12-11 DIAGNOSIS — N50819 Testicular pain, unspecified: Secondary | ICD-10-CM | POA: Insufficient documentation

## 2019-12-11 NOTE — Assessment & Plan Note (Signed)
Some blood in urine. Possibly referred pain from kidney stone.  Will obtain KUB.

## 2019-12-11 NOTE — Assessment & Plan Note (Signed)
Low back pain with RLE weakness and pain.   Normal xrays within the past year No hyper-reflexia Will obtain MRI of lumbar spine.

## 2019-12-11 NOTE — Progress Notes (Signed)
Matthew Daniel - 54 y.o. male MRN [id number]  Date of birth: [birth year]  Subjective Chief Complaint  Patient presents with  . Back Pain  . Extremity Weakness    HPI Matthew Daniel is a 54 y.o. male here today for initial visit.  He has been in pretty good overall health.  History of CKD III, he is followed by nephrology as well.   His concern today is low back pain with radiation into the R leg.  Low back pain is chronic but developed pain and weakness in the right leg a few weeks ago.  He was having sensation that he was tripping with possible foot drop.  No known injury.  Left leg is normal.  He has not noticed weakness in any other extremities.  Xray of L spine 01/2019 Mild disc space narrowing L5-S1  He also has complaint of some mild L testicular pain.  Denies penile discharge, testicular swelling, dysuria, fever or chills.  He has had kidney stone previously.    ROS:  A comprehensive ROS was completed and negative except as noted per HPI  Allergies  Allergen Reactions  . Oxycodone Nausea And Vomiting and Rash  . Sulfa Antibiotics Hives and Rash    Past Medical History:  Diagnosis Date  . Diverticulitis   . History of kidney stones     Past Surgical History:  Procedure Laterality Date  . COLONOSCOPY    . EYE SURGERY    . INCISION AND DRAINAGE PERIRECTAL ABSCESS N/A 03/20/2014   Procedure: EXAM UNDER ANESTHESIA with SUPERFICIAL FISTULOTOMY WITH MARSUPIALIZATION;  Surgeon: Michael Boston, MD;  Location: [facility];  Service: General;  Laterality: N/A;  . REFRACTIVE SURGERY      Social History   Socioeconomic History  . Marital status: Single    Spouse name: Not on file  . Number of children: Not on file  . Years of education: Not on file  . Highest education level: Not on file  Occupational History  . Occupation: Buyer, retail  Tobacco Use  . Smoking status: Never Smoker  . Smokeless tobacco: Never Used  Vaping Use  . Vaping Use: Never used  Substance  and Sexual Activity  . Alcohol use: No    Alcohol/week: 0.0 standard drinks  . Drug use: No  . Sexual activity: Not Currently    Partners: Female  Other Topics Concern  . Not on file  Social History Narrative  . Not on file   Social Determinants of Health   Financial Resource Strain:   . Difficulty of Paying Living Expenses: Not on file  Food Insecurity:   . Worried About Charity fundraiser in the Last Year: Not on file  . Ran Out of Food in the Last Year: Not on file  Transportation Needs:   . Lack of Transportation (Medical): Not on file  . Lack of Transportation (Non-Medical): Not on file  Physical Activity:   . Days of Exercise per Week: Not on file  . Minutes of Exercise per Session: Not on file  Stress:   . Feeling of Stress : Not on file  Social Connections:   . Frequency of Communication with Friends and Family: Not on file  . Frequency of Social Gatherings with Friends and Family: Not on file  . Attends Religious Services: Not on file  . Active Member of Clubs or Organizations: Not on file  . Attends Archivist Meetings: Not on file  . Marital Status: Not on file  Family History  Problem Relation Age of Onset  . Lung cancer Mother 32  . Hypertension Brother   . Hypertension Sister     Health Maintenance  Topic Date Due  . Hepatitis C Screening  Never done  . COVID-19 Vaccine (1) Never done  . INFLUENZA VACCINE  09/11/2019  . TETANUS/TDAP  09/30/2023  . COLONOSCOPY  06/06/2026  . HIV Screening  Completed     ----------------------------------------------------------------------------------------------------------------------------------------------------------------------------------------------------------------- Physical Exam BP (!) 144/82 (BP Location: Left Arm, Patient Position: Sitting, Cuff Size: Large)   Pulse 85   Temp 98.5 F (36.9 C) (Oral)   Ht 6' 1.03" (1.855 m)   Wt 239 lb 14.4 oz (108.8 kg)   SpO2 99%   BMI 31.62 kg/m    Physical Exam Constitutional:      Appearance: Normal appearance.  HENT:     Head: Normocephalic and atraumatic.  Cardiovascular:     Rate and Rhythm: Normal rate and regular rhythm.  Pulmonary:     Effort: Pulmonary effort is normal.     Breath sounds: Normal breath sounds.  Abdominal:     Hernia: There is no hernia in the left inguinal area or right inguinal area.  Genitourinary:    Testes: Normal.     Epididymis:     Right: Normal.     Left: Normal.  Musculoskeletal:     Cervical back: Neck supple.     Comments: Low back normal to inspection and palpation.  SLR with mild pain.  Mild weakness of RLE compared to L.  DTR 1+ throughout lower extremities.   Mild pain with FABER test.   Skin:    General: Skin is warm and dry.  Neurological:     General: No focal deficit present.     Mental Status: He is alert.  Psychiatric:        Mood and Affect: Mood normal.        Behavior: Behavior normal.     ------------------------------------------------------------------------------------------------------------------------------------------------------------------------------------------------------------------- Assessment and Plan  Testicular pain Some blood in urine. Possibly referred pain from kidney stone.  Will obtain KUB.    Lumbar myelopathy (HCC) Low back pain with RLE weakness and pain.   Normal xrays within the past year No hyper-reflexia Will obtain MRI of lumbar spine.   No orders of the defined types were placed in this encounter.  Orders Placed This Encounter  Procedures  . MR Lumbar Spine Wo Contrast    Epic Order WT:239 HT:6'1 No Special needs/No claus/No metal in EYE/dental, No other implants/No glucose monitor, Spinal stimulator, or injectors/No Bullets or BB's/No brain, heart or ear SX, Yes lasik SX over 7 years ago / No Prev spine SX/NO to covid/ AC w/PT/ORDER CHECKED 12/09/19     Standing Status:   Future    Standing Expiration Date:    12/08/2020    Order Specific Question:   What is the patient's sedation requirement?    Answer:   No Sedation    Order Specific Question:   Does the patient have a pacemaker or implanted devices?    Answer:   No    Order Specific Question:   Preferred imaging location?    Answer:   [facility] (table limit-550lbs)  . DG Abd 1 View    Standing Status:   Future    Number of Occurrences:   1    Standing Expiration Date:   12/08/2020    Order Specific Question:   Reason for Exam (SYMPTOM  OR DIAGNOSIS REQUIRED)  Answer:   groin pain, hematuria, history of kidney stone.    Order Specific Question:   Preferred imaging location?    Answer:   [facility]  . POCT URINALYSIS DIP (CLINITEK)    No follow-ups on file.    This visit occurred during the SARS-CoV-2 public health emergency.  Safety protocols were in place, including screening questions prior to the visit, additional usage of staff PPE, and extensive cleaning of exam room while observing appropriate contact time as indicated for disinfecting solutions.

## 2019-12-12 DIAGNOSIS — N1831 Chronic kidney disease, stage 3a: Secondary | ICD-10-CM | POA: Diagnosis not present

## 2019-12-14 ENCOUNTER — Telehealth: Payer: Self-pay | Admitting: Family Medicine

## 2019-12-14 NOTE — Telephone Encounter (Signed)
Called pt about his appt on 05/24/20 at 1:45, I moved it up to 9:00 a.m.  His voicemail box was full.  Sent patient MY Chart message.

## 2019-12-15 ENCOUNTER — Other Ambulatory Visit (HOSPITAL_COMMUNITY): Payer: Self-pay | Admitting: Nephrology

## 2019-12-15 MED FILL — LISINOPRIL 5 MG TABLET: 5 | 90 days supply | Qty: 90 | Fill #0

## 2019-12-17 ENCOUNTER — Other Ambulatory Visit: Payer: Self-pay

## 2019-12-17 ENCOUNTER — Ambulatory Visit
Admission: RE | Admit: 2019-12-17 | Discharge: 2019-12-17 | Disposition: A | Discharge status: Home / Self Care | Payer: 59 | Source: Ambulatory Visit | Attending: Family Medicine | Admitting: Family Medicine

## 2019-12-17 DIAGNOSIS — M5127 Other intervertebral disc displacement, lumbosacral region: Secondary | ICD-10-CM | POA: Diagnosis not present

## 2019-12-17 DIAGNOSIS — G959 Disease of spinal cord, unspecified: Secondary | ICD-10-CM

## 2019-12-17 DIAGNOSIS — M47816 Spondylosis without myelopathy or radiculopathy, lumbar region: Secondary | ICD-10-CM | POA: Diagnosis not present

## 2019-12-20 ENCOUNTER — Encounter: Payer: Self-pay | Admitting: Family Medicine

## 2019-12-20 ENCOUNTER — Other Ambulatory Visit: Payer: Self-pay | Admitting: Family Medicine

## 2019-12-20 DIAGNOSIS — M5416 Radiculopathy, lumbar region: Secondary | ICD-10-CM

## 2019-12-30 DIAGNOSIS — N1831 Chronic kidney disease, stage 3a: Secondary | ICD-10-CM | POA: Diagnosis not present

## 2020-05-18 ENCOUNTER — Encounter: Payer: Self-pay | Admitting: Family Medicine

## 2020-05-18 ENCOUNTER — Ambulatory Visit (INDEPENDENT_AMBULATORY_CARE_PROVIDER_SITE_OTHER): Payer: 59 | Admitting: Family Medicine

## 2020-05-18 ENCOUNTER — Other Ambulatory Visit: Payer: Self-pay

## 2020-05-18 ENCOUNTER — Other Ambulatory Visit (HOSPITAL_COMMUNITY): Payer: Self-pay

## 2020-05-18 VITALS — BP 137/77 | HR 70 | Ht 72.0 in | Wt 242.0 lb

## 2020-05-18 DIAGNOSIS — I10 Essential (primary) hypertension: Secondary | ICD-10-CM | POA: Diagnosis not present

## 2020-05-18 DIAGNOSIS — Z1159 Encounter for screening for other viral diseases: Secondary | ICD-10-CM

## 2020-05-18 DIAGNOSIS — Z0000 Encounter for general adult medical examination without abnormal findings: Secondary | ICD-10-CM | POA: Diagnosis not present

## 2020-05-18 DIAGNOSIS — Z13220 Encounter for screening for lipoid disorders: Secondary | ICD-10-CM

## 2020-05-18 DIAGNOSIS — Z125 Encounter for screening for malignant neoplasm of prostate: Secondary | ICD-10-CM | POA: Diagnosis not present

## 2020-05-18 MED ORDER — AMLODIPINE BESYLATE 5 MG PO TABS
5.0000 mg | ORAL_TABLET | Freq: Every day | ORAL | 3 refills | Status: DC
  Filled 2020-05-18: qty 90, 90d supply, fill #0

## 2020-05-18 NOTE — Patient Instructions (Addendum)
Continue lisinopril at current strength.  See me again in 6 months     Preventive Care 44-55 Years Old, Male Preventive care refers to lifestyle choices and visits with your health care provider that can promote health and wellness. This includes:  A yearly physical exam. This is also called an annual wellness visit.  Regular dental and eye exams.  Immunizations.  Screening for certain conditions.  Healthy lifestyle choices, such as: ? Eating a healthy diet. ? Getting regular exercise. ? Not using drugs or products that contain nicotine and tobacco. ? Limiting alcohol use. What can I expect for my preventive care visit? Physical exam Your health care provider will check your:  Height and weight. These may be used to calculate your BMI (body mass index). BMI is a measurement that tells if you are at a healthy weight.  Heart rate and blood pressure.  Body temperature.  Skin for abnormal spots. Counseling Your health care provider may ask you questions about your:  Past medical problems.  Family's medical history.  Alcohol, tobacco, and drug use.  Emotional well-being.  Home life and relationship well-being.  Sexual activity.  Diet, exercise, and sleep habits.  Work and work Statistician.  Access to firearms. What immunizations do I need? Vaccines are usually given at various ages, according to a schedule. Your health care provider will recommend vaccines for you based on your age, medical history, and lifestyle or other factors, such as travel or where you work.   What tests do I need? Blood tests  Lipid and cholesterol levels. These may be checked every 5 years, or more often if you are over 73 years old.  Hepatitis C test.  Hepatitis B test. Screening  Lung cancer screening. You may have this screening every year starting at age 71 if you have a 30-pack-year history of smoking and currently smoke or have quit within the past 15 years.  Prostate cancer  screening. Recommendations will vary depending on your family history and other risks.  Genital exam to check for testicular cancer or hernias.  Colorectal cancer screening. ? All adults should have this screening starting at age 98 and continuing until age 14. ? Your health care provider may recommend screening at age 82 if you are at increased risk. ? You will have tests every 1-10 years, depending on your results and the type of screening test.  Diabetes screening. ? This is done by checking your blood sugar (glucose) after you have not eaten for a while (fasting). ? You may have this done every 1-3 years.  STD (sexually transmitted disease) testing, if you are at risk. Follow these instructions at home: Eating and drinking  Eat a diet that includes fresh fruits and vegetables, whole grains, lean protein, and low-fat dairy products.  Take vitamin and mineral supplements as recommended by your health care provider.  Do not drink alcohol if your health care provider tells you not to drink.  If you drink alcohol: ? Limit how much you have to 0-2 drinks a day. ? Be aware of how much alcohol is in your drink. In the [place] one drink equals one 12 oz bottle of beer (355 mL), one 5 oz glass of wine (148 mL), or one 1 oz glass of hard liquor (44 mL).   Lifestyle  Take daily care of your teeth and gums. Brush your teeth every morning and night with fluoride toothpaste. Floss one time each day.  Stay active. Exercise for at least 30 minutes  5 or more days each week.  Do not use any products that contain nicotine or tobacco, such as cigarettes, e-cigarettes, and chewing tobacco. If you need help quitting, ask your health care provider.  Do not use drugs.  If you are sexually active, practice safe sex. Use a condom or other form of protection to prevent STIs (sexually transmitted infections).  If told by your health care provider, take low-dose aspirin daily starting at age 65.  Find  healthy ways to cope with stress, such as: ? Meditation, yoga, or listening to music. ? Journaling. ? Talking to a trusted person. ? Spending time with friends and family. Safety  Always wear your seat belt while driving or riding in a vehicle.  Do not drive: ? If you have been drinking alcohol. Do not ride with someone who has been drinking. ? When you are tired or distracted. ? While texting.  Wear a helmet and other protective equipment during sports activities.  If you have firearms in your house, make sure you follow all gun safety procedures. What's next?  Go to your health care provider once a year for an annual wellness visit.  Ask your health care provider how often you should have your eyes and teeth checked.  Stay up to date on all vaccines. This information is not intended to replace advice given to you by your health care provider. Make sure you discuss any questions you have with your health care provider. Document Revised: 10/26/2018 Document Reviewed: 01/21/2018 Elsevier Patient Education  2021 [employer].

## 2020-05-20 DIAGNOSIS — Z0000 Encounter for general adult medical examination without abnormal findings: Secondary | ICD-10-CM | POA: Insufficient documentation

## 2020-05-20 NOTE — Assessment & Plan Note (Signed)
Well adult Orders Placed This Encounter  Procedures  . COMPLETE METABOLIC PANEL WITH GFR  . CBC  . Lipid Panel w/reflex Direct LDL  . TSH  . PSA  . Hepatitis C Antibody  Screening: Hep C, lipid panel, PSA Immunizations: UTD Anticipatory guidance/Risk factor reduction: Recommendations per AVS.

## 2020-05-20 NOTE — Progress Notes (Signed)
Matthew Daniel - 55 y.o. male MRN [id number]  Date of birth: 11-13-1965  Subjective Chief Complaint  Patient presents with  . Annual Exam    HPI Matthew Daniel is a 55 y.o. male here today for annual exam.   He is doing well without new concerns today.  BP elevated initially today, rechecked at end of visit with improvement.  BP at home has been WNL>    He does exercise a few days per week and feels that diet is pretty good.   He is a non-smoker and denies current EtOH use.    He is up to date on colon cancer screening.   Review of Systems  Constitutional: Negative for chills, fever, malaise/fatigue and weight loss.  HENT: Negative for congestion, ear pain and sore throat.   Eyes: Negative for blurred vision, double vision and pain.  Respiratory: Negative for cough and shortness of breath.   Cardiovascular: Negative for chest pain and palpitations.  Gastrointestinal: Negative for abdominal pain, blood in stool, constipation, heartburn and nausea.  Genitourinary: Negative for dysuria and urgency.  Musculoskeletal: Negative for joint pain and myalgias.  Neurological: Negative for dizziness and headaches.  Endo/Heme/Allergies: Does not bruise/bleed easily.  Psychiatric/Behavioral: Negative for depression. The patient is not nervous/anxious and does not have insomnia.     Allergies  Allergen Reactions  . Hydrocodone-Acetaminophen Nausea Only  . Oxycodone Nausea And Vomiting and Rash  . Sulfa Antibiotics Hives and Rash    Past Medical History:  Diagnosis Date  . Diverticulitis   . History of kidney stones     Past Surgical History:  Procedure Laterality Date  . COLONOSCOPY    . EYE SURGERY    . INCISION AND DRAINAGE PERIRECTAL ABSCESS N/A 03/20/2014   Procedure: EXAM UNDER ANESTHESIA with SUPERFICIAL FISTULOTOMY WITH MARSUPIALIZATION;  Surgeon: Michael Boston, MD;  Location: [facility];  Service: General;  Laterality: N/A;  . REFRACTIVE SURGERY      Social History    Socioeconomic History  . Marital status: Single    Spouse name: Not on file  . Number of children: Not on file  . Years of education: Not on file  . Highest education level: Not on file  Occupational History  . Occupation: Buyer, retail  Tobacco Use  . Smoking status: Never Smoker  . Smokeless tobacco: Never Used  Vaping Use  . Vaping Use: Never used  Substance and Sexual Activity  . Alcohol use: No    Alcohol/week: 0.0 standard drinks  . Drug use: No  . Sexual activity: Not Currently    Partners: Female  Other Topics Concern  . Not on file  Social History Narrative  . Not on file   Social Determinants of Health   Financial Resource Strain: Not on file  Food Insecurity: Not on file  Transportation Needs: Not on file  Physical Activity: Not on file  Stress: Not on file  Social Connections: Not on file    Family History  Problem Relation Age of Onset  . Lung cancer Mother 83  . Hypertension Brother   . Hypertension Sister     Health Maintenance  Topic Date Due  . Hepatitis C Screening  Never done  . INFLUENZA VACCINE  09/10/2020  . TETANUS/TDAP  09/30/2023  . COLONOSCOPY (Pts 45-57yrs Insurance coverage will need to be confirmed)  06/06/2026  . COVID-19 Vaccine  Completed  . HIV Screening  Completed  . HPV VACCINES  Aged Out     ----------------------------------------------------------------------------------------------------------------------------------------------------------------------------------------------------------------- Physical  Exam BP 137/77 (BP Location: Left Arm, Patient Position: Sitting, Cuff Size: Large)   Pulse 70   Ht 6' (1.829 m)   Wt 242 lb (109.8 kg)   SpO2 96%   BMI 32.82 kg/m   Physical Exam Constitutional:      General: He is not in acute distress. HENT:     Head: Normocephalic and atraumatic.     Right Ear: External ear normal.     Left Ear: External ear normal.  Eyes:     General: No scleral icterus. Neck:      Thyroid: No thyromegaly.  Cardiovascular:     Rate and Rhythm: Normal rate and regular rhythm.     Heart sounds: Normal heart sounds.  Pulmonary:     Effort: Pulmonary effort is normal.     Breath sounds: Normal breath sounds.  Abdominal:     General: Bowel sounds are normal. There is no distension.     Palpations: Abdomen is soft.     Tenderness: There is no abdominal tenderness. There is no guarding.  Musculoskeletal:     Cervical back: Normal range of motion.  Lymphadenopathy:     Cervical: No cervical adenopathy.  Skin:    General: Skin is warm and dry.     Findings: No rash.  Neurological:     Mental Status: He is alert and oriented to person, place, and time.     Cranial Nerves: No cranial nerve deficit.     Motor: No abnormal muscle tone.  Psychiatric:        Behavior: Behavior normal.     ------------------------------------------------------------------------------------------------------------------------------------------------------------------------------------------------------------------- Assessment and Plan  Well adult exam Well adult Orders Placed This Encounter  Procedures  . COMPLETE METABOLIC PANEL WITH GFR  . CBC  . Lipid Panel w/reflex Direct LDL  . TSH  . PSA  . Hepatitis C Antibody  Screening: Hep C, lipid panel, PSA Immunizations: UTD Anticipatory guidance/Risk factor reduction: Recommendations per AVS.     Meds ordered this encounter  Medications  . DISCONTD: amLODipine (NORVASC) 5 MG tablet    Sig: Take 1 tablet (5 mg total) by mouth daily.    Dispense:  90 tablet    Refill:  3    Return in about 6 months (around 11/17/2020) for HTN.    This visit occurred during the SARS-CoV-2 public health emergency.  Safety protocols were in place, including screening questions prior to the visit, additional usage of staff PPE, and extensive cleaning of exam room while observing appropriate contact time as indicated for disinfecting  solutions.

## 2020-05-21 LAB — COMPLETE METABOLIC PANEL WITH GFR
AG Ratio: 1.9 (calc) (ref 1.0–2.5)
ALT: 23 U/L (ref 9–46)
AST: 23 U/L (ref 10–35)
Albumin: 4.7 g/dL (ref 3.6–5.1)
Alkaline phosphatase (APISO): 46 U/L (ref 35–144)
BUN/Creatinine Ratio: 10 (calc) (ref 6–22)
BUN: 14 mg/dL (ref 7–25)
CO2: 26 mmol/L (ref 20–32)
Calcium: 9.4 mg/dL (ref 8.6–10.3)
Chloride: 106 mmol/L (ref 98–110)
Creat: 1.44 mg/dL — ABNORMAL HIGH (ref 0.70–1.33)
GFR, Est African American: 63 mL/min/{1.73_m2} (ref >=60)
GFR, Est Non African American: 55 mL/min/{1.73_m2} — ABNORMAL LOW (ref >=60)
Globulin: 2.5 g/dL (calc) (ref 1.9–3.7)
Glucose, Bld: 86 mg/dL (ref 65–99)
Potassium: 4.2 mmol/L (ref 3.5–5.3)
Sodium: 140 mmol/L (ref 135–146)
Total Bilirubin: 0.9 mg/dL (ref 0.2–1.2)
Total Protein: 7.2 g/dL (ref 6.1–8.1)

## 2020-05-21 LAB — CBC
HCT: 41.1 % (ref 38.5–50.0)
Hemoglobin: 13.9 g/dL (ref 13.2–17.1)
MCH: 29.8 pg (ref 27.0–33.0)
MCHC: 33.8 g/dL (ref 32.0–36.0)
MCV: 88.2 fL (ref 80.0–100.0)
MPV: 10.1 fL (ref 7.5–12.5)
Platelets: 228 10*3/uL (ref 140–400)
RBC: 4.66 10*6/uL (ref 4.20–5.80)
RDW: 13.2 % (ref 11.0–15.0)
WBC: 3.7 10*3/uL — ABNORMAL LOW (ref 3.8–10.8)

## 2020-05-21 LAB — LIPID PANEL W/REFLEX DIRECT LDL
Cholesterol: 171 mg/dL (ref <200)
HDL: 47 mg/dL (ref >=40)
LDL Cholesterol (Calc): 109 mg/dL (calc) — ABNORMAL HIGH
Non-HDL Cholesterol (Calc): 124 mg/dL (calc) (ref <130)
Total CHOL/HDL Ratio: 3.6 (calc) (ref <5.0)
Triglycerides: 67 mg/dL (ref <150)

## 2020-05-21 LAB — HEPATITIS C ANTIBODY
Hepatitis C Ab: NONREACTIVE
SIGNAL TO CUT-OFF: 0.01 (ref <1.00)

## 2020-05-21 LAB — PSA: PSA: 1.39 ng/mL (ref <=4.0)

## 2020-05-21 LAB — TSH: TSH: 1.3 mIU/L (ref 0.40–4.50)

## 2020-05-24 ENCOUNTER — Encounter: Payer: 59 | Admitting: Family Medicine

## 2020-06-14 DIAGNOSIS — I10 Essential (primary) hypertension: Secondary | ICD-10-CM | POA: Diagnosis not present

## 2020-06-14 DIAGNOSIS — E559 Vitamin D deficiency, unspecified: Secondary | ICD-10-CM | POA: Diagnosis not present

## 2020-06-14 DIAGNOSIS — N1831 Chronic kidney disease, stage 3a: Secondary | ICD-10-CM | POA: Diagnosis not present

## 2020-07-05 ENCOUNTER — Other Ambulatory Visit (HOSPITAL_COMMUNITY): Payer: Self-pay

## 2020-07-05 MED FILL — Lisinopril Tab 10 MG: ORAL | 90 days supply | Qty: 90 | Fill #0 | Status: AC

## 2020-07-11 ENCOUNTER — Other Ambulatory Visit (HOSPITAL_COMMUNITY): Payer: Self-pay

## 2020-07-11 ENCOUNTER — Other Ambulatory Visit: Payer: Self-pay

## 2020-07-11 MED ORDER — LISINOPRIL 5 MG PO TABS
5.0000 mg | ORAL_TABLET | Freq: Every day | ORAL | 2 refills | Status: DC
  Filled 2020-07-11: qty 30, 30d supply, fill #0

## 2020-07-12 ENCOUNTER — Other Ambulatory Visit (HOSPITAL_COMMUNITY): Payer: Self-pay

## 2020-08-12 ENCOUNTER — Ambulatory Visit (HOSPITAL_COMMUNITY)
Admission: EM | Admit: 2020-08-12 | Discharge: 2020-08-12 | Disposition: A | Discharge status: Home / Self Care | Payer: 59 | Attending: Physician Assistant | Admitting: Physician Assistant

## 2020-08-12 ENCOUNTER — Encounter (HOSPITAL_COMMUNITY): Payer: Self-pay | Admitting: Emergency Medicine

## 2020-08-12 DIAGNOSIS — M79631 Pain in right forearm: Secondary | ICD-10-CM | POA: Diagnosis not present

## 2020-08-12 DIAGNOSIS — M791 Myalgia, unspecified site: Secondary | ICD-10-CM

## 2020-08-12 MED ORDER — TIZANIDINE HCL 4 MG PO CAPS: 4.0000 mg | ORAL_CAPSULE | Freq: Three times a day (TID) | ORAL | 0 refills | Status: DC | PRN

## 2020-08-12 MED ORDER — PREDNISONE 20 MG PO TABS: 40.0000 mg | ORAL_TABLET | Freq: Every day | ORAL | 0 refills | Status: AC

## 2020-08-12 NOTE — Discharge Instructions (Addendum)
I believe that you strained a muscle.  Please use prednisone 40 mg daily for 4 days to help with inflammation and pain.  You should not take any NSAIDs (aspirin, ibuprofen/Advil, naproxen/Aleve) with this medication as it can cause stomach bleeding.  Take tizanidine up to 3 times a day as needed for muscle pain.  This is a muscle relaxant will make you sleepy so do not drive or drink alcohol with it.  Avoid repetitive and/or strenuous movements.  If you have any worsening symptoms including swelling, redness, increased pain, decreased range of motion you need to go to the emergency room.

## 2020-08-12 NOTE — ED Triage Notes (Signed)
Pt is present today with right arm pain. Pt states that is pain started two months ago. Pt states that the pain can radiates to the right side of his neck. Pt states that he does have numbness but no tingling.

## 2020-08-12 NOTE — ED Provider Notes (Signed)
[facility]    CSN: [id number] Arrival date & time: 08/12/20  1002      History   Chief Complaint Chief Complaint  Patient presents with   Arm Pain    HPI Matthew Daniel is a 55 y.o. male.   Patient presents today with a several month history of right arm pain.  Patient reports that symptoms began after he had been on social media and was scrolling for several hours without a break.  He then developed pain in his right forearm which has been persistent since that time.  Currently pain is rated 9 on a 0-10 pain scale, localized to forearm with radiation into upper arm and towards the neck, described as throbbing, worse with movement, no alleviating factors identified.  Patient reports that symptoms worsened 2 days ago after he went to the gym.  He denies any specific injury.  Denies previous surgery to right arm.  He does report intermittent numbness but denies any paresthesias.  He has tried topical medications as well as Tylenol without improvement of symptoms.  He is right-handed.  He denies any history of diabetes.   Past Medical History:  Diagnosis Date   Diverticulitis    History of kidney stones     Patient Active Problem List   Diagnosis Date Noted   Well adult exam 05/20/2020   Testicular pain 12/11/2019   Lumbar myelopathy ([place]) 12/11/2019   CKD (chronic kidney disease), stage III ([place]) 10/08/2017   Diverticulosis of large intestine without hemorrhage 10/16/2016   History of renal stone 12/15/2014   History of colonic polyps 12/15/2014   History of hemorrhoids 12/15/2014   Gastroesophageal reflux disease without esophagitis 12/15/2014   History of diverticulitis 12/15/2014    Past Surgical History:  Procedure Laterality Date   COLONOSCOPY     EYE SURGERY     INCISION AND DRAINAGE PERIRECTAL ABSCESS N/A 03/20/2014   Procedure: EXAM UNDER ANESTHESIA with SUPERFICIAL FISTULOTOMY WITH MARSUPIALIZATION;  Surgeon: Michael Boston, MD;  Location: [facility];   Service: General;  Laterality: N/A;   REFRACTIVE SURGERY         Home Medications    Prior to Admission medications   Medication Sig Start Date End Date Taking? Authorizing Provider  predniSONE (DELTASONE) 20 MG tablet Take 2 tablets (40 mg total) by mouth daily for 4 days. 08/12/20 08/16/20 Yes Yasenia Reedy K, PA-C  tiZANidine (ZANAFLEX) 4 MG capsule Take 1 capsule (4 mg total) by mouth 3 (three) times daily as needed for muscle spasms. 08/12/20  Yes Kijuana Ruppel, Derry Skill, PA-C  acetaminophen (TYLENOL) 500 MG tablet Take 1,000 mg by mouth every 6 (six) hours as needed.    [provider]  cholecalciferol (VITAMIN D3) 25 MCG (1000 UNIT) tablet Take 1,000 Units by mouth daily.    [provider]  GARLIC PO Take 1 capsule by mouth daily.     [provider]  lisinopril (ZESTRIL) 5 MG tablet Take 1 tablet by mouth daily 07/11/20     Multiple Vitamin (MULTIVITAMIN) tablet Take 0.5 tablets by mouth 2 (two) times daily.     [provider]  Omega-3 Fatty Acids (OMEGA 3 PO) Take by mouth.    [provider]    Family History Family History  Problem Relation Age of Onset   Lung cancer Mother 27   Hypertension Brother    Hypertension Sister     Social History Social History   Tobacco Use   Smoking status: Never  Smokeless tobacco: Never  Vaping Use   Vaping Use: Never used  Substance Use Topics   Alcohol use: No    Alcohol/week: 0.0 standard drinks   Drug use: No     Allergies   Hydrocodone-acetaminophen, Oxycodone, and Sulfa antibiotics   Review of Systems Review of Systems  Constitutional:  Positive for activity change. Negative for appetite change, fatigue and fever.  Respiratory:  Negative for cough and shortness of breath.   Cardiovascular:  Negative for chest pain.  Gastrointestinal:  Negative for abdominal pain, diarrhea, nausea and vomiting.  Musculoskeletal:  Positive for myalgias. Negative for arthralgias.  Neurological:  Negative  for dizziness, weakness, light-headedness, numbness and headaches.    Physical Exam Triage Vital Signs ED Triage Vitals  Enc Vitals Group     BP 08/12/20 1022 135/81     Pulse Rate 08/12/20 1022 81     Resp 08/12/20 1022 18     Temp 08/12/20 1022 99.1 F (37.3 C)     Temp Source 08/12/20 1022 Oral     SpO2 08/12/20 1022 99 %     Weight --      Height --      Head Circumference --      Peak Flow --      Pain Score 08/12/20 1019 9     Pain Loc --      Pain Edu? --      Excl. in [place]? --    No data found.  Updated Vital Signs BP 135/81 (BP Location: Left Arm)   Pulse 81   Temp 99.1 F (37.3 C) (Oral)   Resp 18   SpO2 99%   Visual Acuity Right Eye Distance:   Left Eye Distance:   Bilateral Distance:    Right Eye Near:   Left Eye Near:    Bilateral Near:     Physical Exam Vitals reviewed.  Constitutional:      General: He is awake.     Appearance: Normal appearance. He is normal weight. He is not ill-appearing.     Comments: Very pleasant male appears stated age in no acute distress  HENT:     Head: Normocephalic and atraumatic.     Mouth/Throat:     Pharynx: Uvula midline. No oropharyngeal exudate or posterior oropharyngeal erythema.  Cardiovascular:     Rate and Rhythm: Normal rate and regular rhythm.     Heart sounds: Normal heart sounds, S1 normal and S2 normal. No murmur heard. Pulmonary:     Effort: Pulmonary effort is normal.     Breath sounds: Normal breath sounds. No stridor. No wheezing, rhonchi or rales.     Comments: Clear to auscultation bilaterally Musculoskeletal:     Right elbow: No swelling or deformity.     Right forearm: Tenderness present. No swelling or bony tenderness.     Right hand: No swelling or tenderness. Normal range of motion. There is no disruption of two-point discrimination.     Cervical back: Normal range of motion and neck supple. No spinous process tenderness or muscular tenderness.     Comments: Right hand/arm: Normal  active range of motion at shoulder, elbow, wrist.  Hand neurovascularly intact.  Mild tenderness palpation over brachioradialis.  No deformity noted.  Negative Finkelstein and Phalen's.  Neurological:     Mental Status: He is alert.  Psychiatric:        Behavior: Behavior is cooperative.     UC Treatments / Results  Labs (all labs ordered are  listed, but only abnormal results are displayed) Labs Reviewed - No data to display  EKG   Radiology No results found.  Procedures Procedures (including critical care time)  Medications Ordered in UC Medications - No data to display  Initial Impression / Assessment and Plan / UC Course  I have reviewed the triage vital signs and the nursing notes.  Pertinent labs & imaging results that were available during my care of the patient were reviewed by me and considered in my medical decision making (see chart for details).      No indication for x-ray given no traumatic injury, deformity, bony tenderness.  Patient is unable to take NSAIDs due to chronic kidney disease so was prescribed prednisone burst in the hopes this would provide pain relief.  He was prescribed muscle relaxer with instruction not to drive or drink alcohol while taking this medication as drowsiness is a common side effect.  Recommended that he avoid strenuous or repetitive motions and rest arm for several days.  Discussed at length alarm symptoms that warrant emergent evaluation.  He was provided contact information for sports medicine clinic in case symptoms do not improve with treatment measures.  Strict return precautions given to which patient expressed understanding.  Final Clinical Impressions(s) / UC Diagnoses   Final diagnoses:  Right forearm pain  Muscle pain     Discharge Instructions      I believe that you strained a muscle.  Please use prednisone 40 mg daily for 4 days to help with inflammation and pain.  You should not take any NSAIDs (aspirin,  ibuprofen/Advil, naproxen/Aleve) with this medication as it can cause stomach bleeding.  Take tizanidine up to 3 times a day as needed for muscle pain.  This is a muscle relaxant will make you sleepy so do not drive or drink alcohol with it.  Avoid repetitive and/or strenuous movements.  If you have any worsening symptoms including swelling, redness, increased pain, decreased range of motion you need to go to the emergency room.     ED Prescriptions     Medication Sig Dispense Auth. Provider   predniSONE (DELTASONE) 20 MG tablet Take 2 tablets (40 mg total) by mouth daily for 4 days. 8 tablet Scheryl Sanborn K, PA-C   tiZANidine (ZANAFLEX) 4 MG capsule Take 1 capsule (4 mg total) by mouth 3 (three) times daily as needed for muscle spasms. 21 capsule Eeshan Verbrugge K, PA-C      PDMP not reviewed this encounter.   Terrilee Croak, PA-C 08/12/20 1040

## 2020-08-23 DIAGNOSIS — H5202 Hypermetropia, left eye: Secondary | ICD-10-CM | POA: Diagnosis not present

## 2020-08-23 DIAGNOSIS — H2513 Age-related nuclear cataract, bilateral: Secondary | ICD-10-CM | POA: Diagnosis not present

## 2020-08-23 DIAGNOSIS — H04123 Dry eye syndrome of bilateral lacrimal glands: Secondary | ICD-10-CM | POA: Diagnosis not present

## 2020-08-23 DIAGNOSIS — H5211 Myopia, right eye: Secondary | ICD-10-CM | POA: Diagnosis not present

## 2020-08-23 DIAGNOSIS — H52223 Regular astigmatism, bilateral: Secondary | ICD-10-CM | POA: Diagnosis not present

## 2020-08-23 DIAGNOSIS — H40013 Open angle with borderline findings, low risk, bilateral: Secondary | ICD-10-CM | POA: Diagnosis not present

## 2020-11-13 ENCOUNTER — Other Ambulatory Visit (HOSPITAL_COMMUNITY): Payer: Self-pay

## 2020-11-13 MED ORDER — LOSARTAN POTASSIUM 25 MG PO TABS
25.0000 mg | ORAL_TABLET | Freq: Every day | ORAL | 6 refills | Status: DC
  Filled 2020-11-13: qty 30, 30d supply, fill #0
  Filled 2021-01-17: qty 30, 30d supply, fill #1

## 2020-11-26 DIAGNOSIS — N1831 Chronic kidney disease, stage 3a: Secondary | ICD-10-CM | POA: Diagnosis not present

## 2020-11-28 IMAGING — MR MR LUMBAR SPINE W/O CM
4 of 5 series · 26 of 48 positions shown · non-contrast
Comparison: Lumbar spine radiographs 01/17/2019.

CLINICAL DATA: Provided history: Lumbar myelopathy. Low back pain,
progressive neurologic deficit. Additional history provided by
scanning technologist: Patient reports right-sided back pain with
numbness radiating down right leg.

EXAM:
MRI LUMBAR SPINE WITHOUT CONTRAST
TECHNIQUE: Multiplanar, multisequence MR imaging of the lumbar spine was
performed. No intravenous contrast was administered.

[Series 3: T2 · sagittal · 4.0mm · 1.09mm/px · 6 of 17 slices shown (1 of 2)]
[im 1/17]
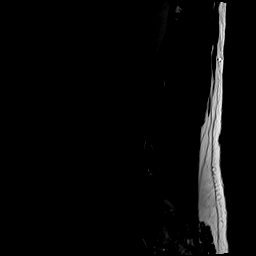
[im 4/17]
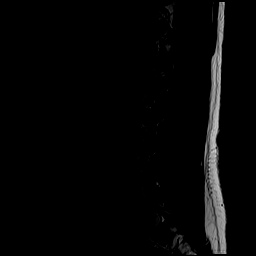
[im 7/17]
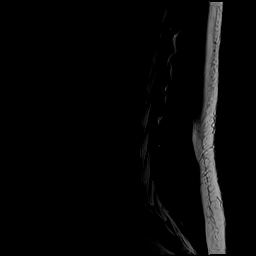
[im 10/17]
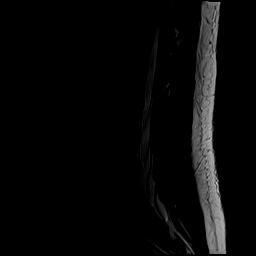
[im 13/17]
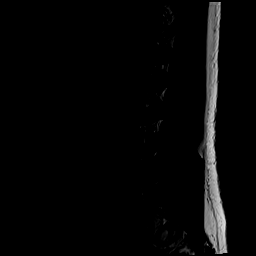
[im 17/17]
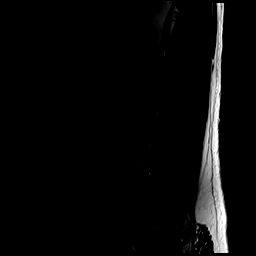

[Series 5: T1 · sagittal · 4.0mm · 1.09mm/px · 6 of 17 slices shown (1 of 2)]
[im 1/17]
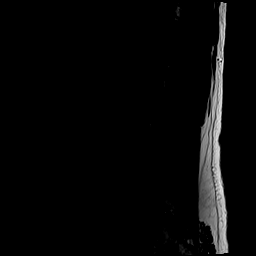
[im 4/17]
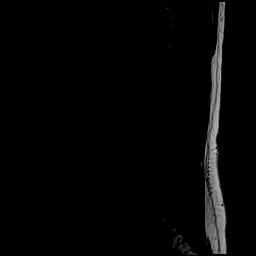
[im 7/17]
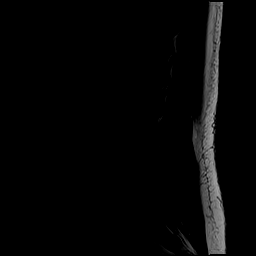
[im 10/17]
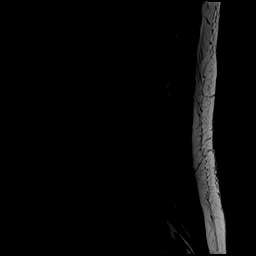
[im 13/17]
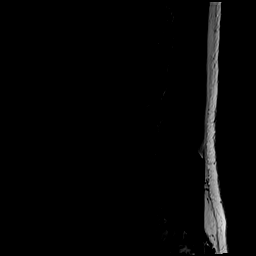
[im 17/17]
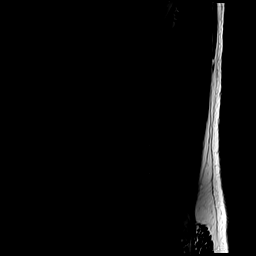

[Series 6: T2 · axial · 4.0mm · 0.39mm/px · z∈[-112,+102]mm · 9 of 43 slices shown (2 of 2)]
[im 1/43]
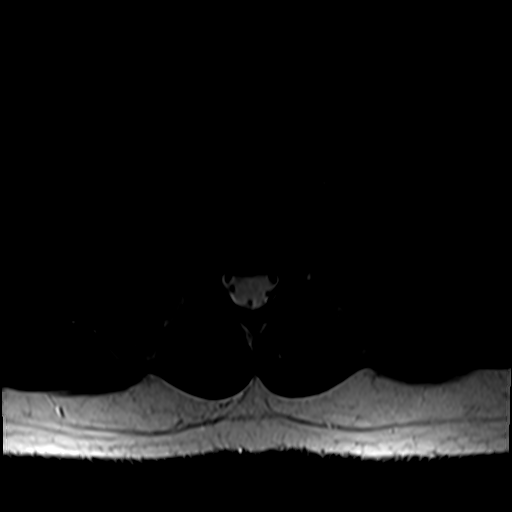
[im 7/43]
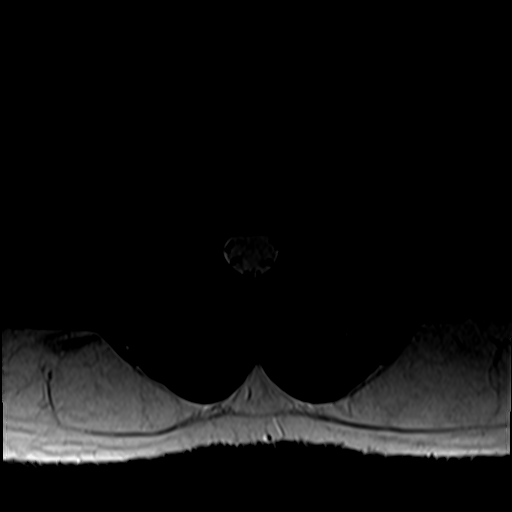
[im 13/43]
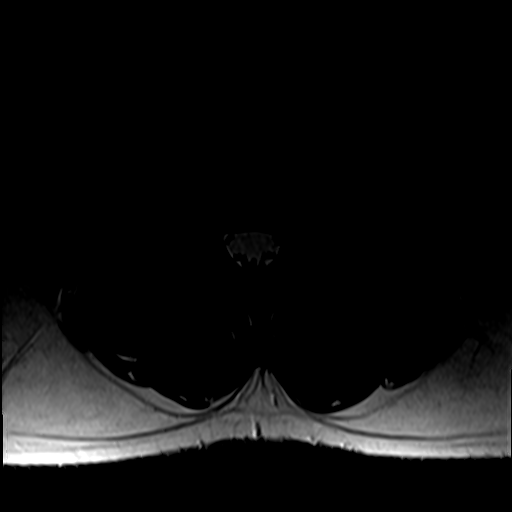
[im 19/43]
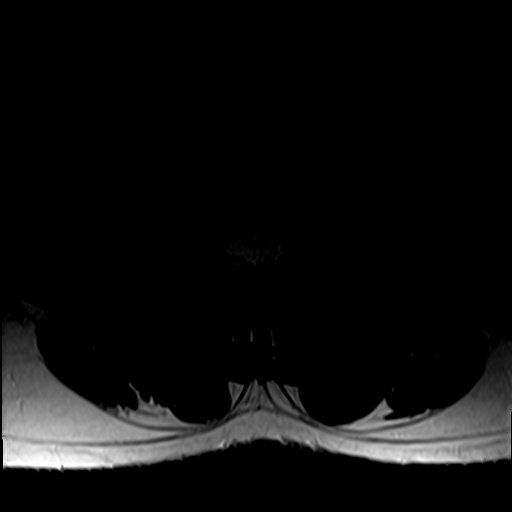
[im 22/43]
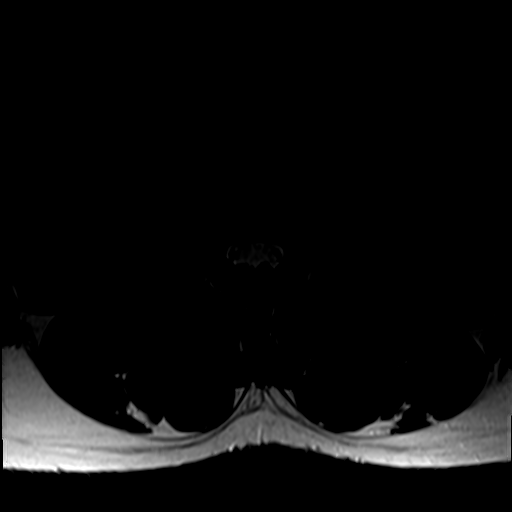
[im 25/43]
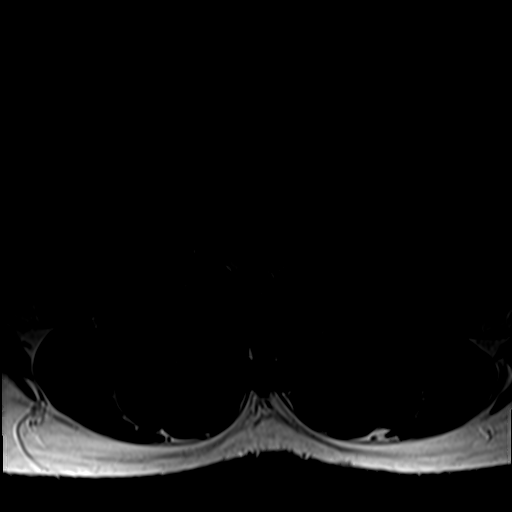
[im 31/43]
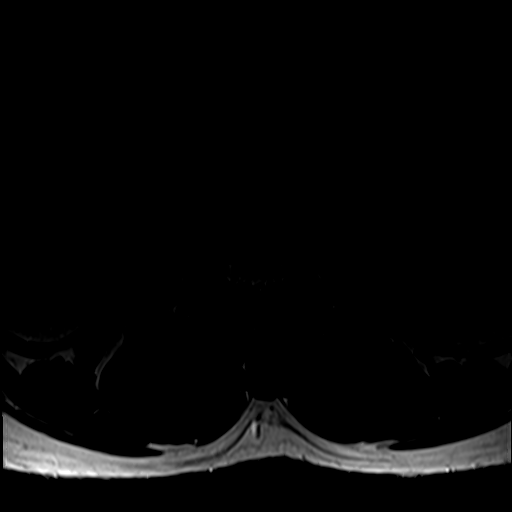
[im 37/43]
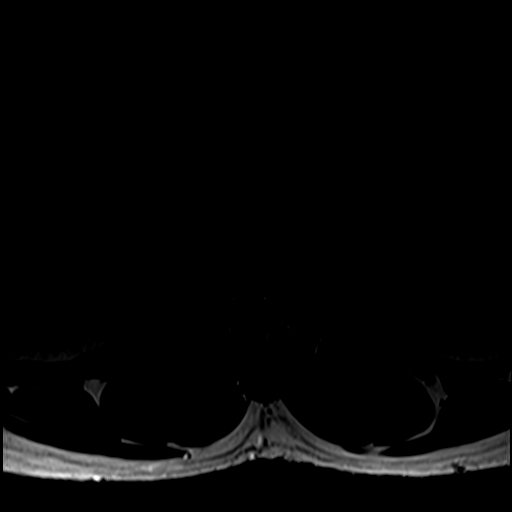
[im 43/43]
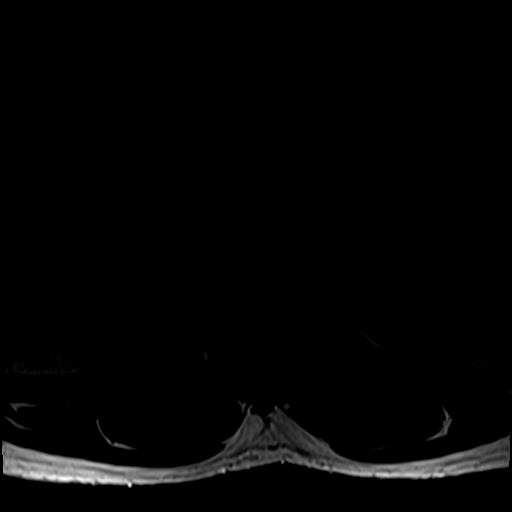

[Series 7: T1 · axial · 4.0mm · 0.39mm/px · z∈[-112,+74]mm · 5 of 43 slices shown (2 of 2)]
[im 1/43]
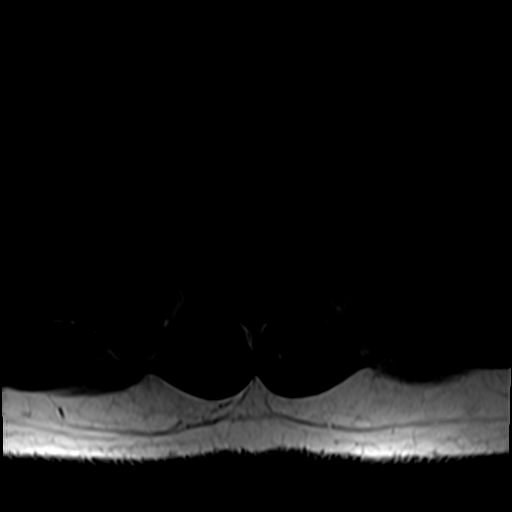
[im 7/43]
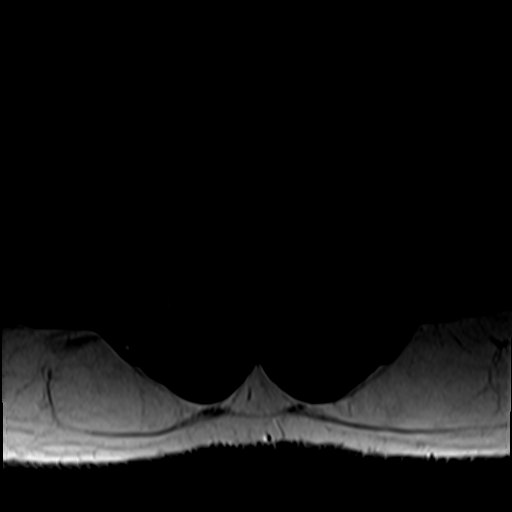
[im 13/43]
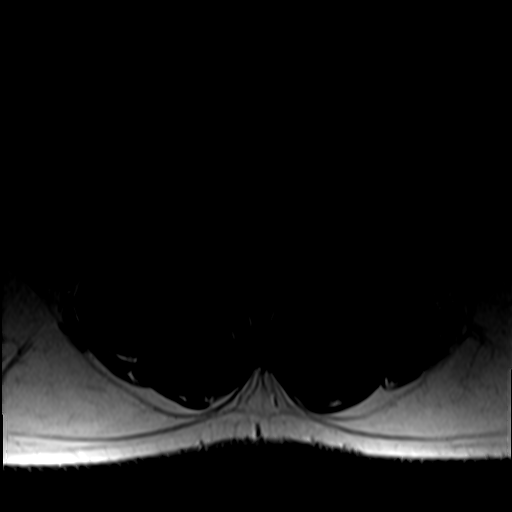
[im 22/43]
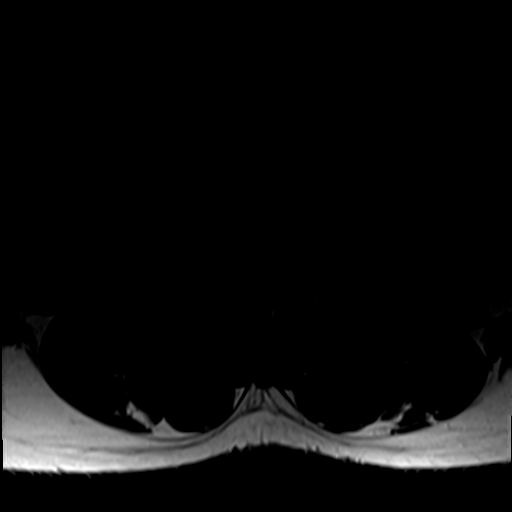
[im 37/43]
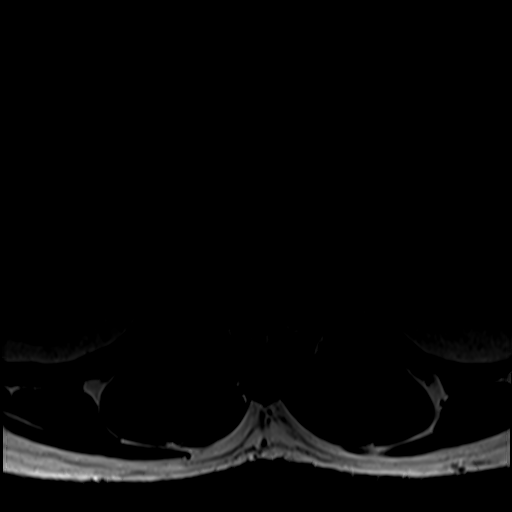

[26 of 48 positions shown; findings below may reference images not displayed]

FINDINGS: Segmentation: 5 lumbar vertebrae (correlating with prior lumbar
spine radiographs 01/17/2019).

Alignment: Straightening of the expected lumbar lordosis. Trace
L5-S1 grade 1 retrolisthesis.

Vertebrae: Vertebral body height is maintained. Trace degenerative
edema along the L5 inferior endplate.

Conus medullaris and cauda equina: Conus extends to the L2 level. No
signal abnormality within the visualized distal spinal cord.

Paraspinal and other soft tissues: Incompletely assessed right renal
cyst. Paraspinal soft tissues within normal limits.

Disc levels:

Mild disc degeneration at L3-L4, L4-L5 and L5-S1.

T12-L1: No significant disc herniation or stenosis.

L1-L2: No significant disc herniation or stenosis.

L2-L3: Central zone posterior annular fissure. No significant disc
herniation or stenosis.

L3-L4: Minimal disc bulge. No significant spinal canal or foraminal
stenosis.

L4-L5: Small disc bulge with endplate spurring. Superimposed
broad-based left subarticular/foraminal disc protrusion. No
significant spinal canal stenosis. Moderate left neural foraminal
narrowing with possible contact upon the undersurface of the exiting
left L4 nerve root (series 5, image 12). Borderline mild right
neural foraminal narrowing. 3 mm posteriorly projecting synovial
facet cyst on the right.

L5-S1: Trace retrolisthesis. Central posterior annular fissure. Mild
disc bulge and endplate spurring. Minimal effacement of the ventral
thecal sac without significant subarticular or central canal
stenosis. No significant foraminal narrowing.
IMPRESSION: Lumbar spondylosis as outlined with findings most notably as
follows.

At L4-L5, there is mild disc degeneration. Small disc bulge with
endplate spurring. Superimposed broad-based left
subarticular/foraminal disc protrusion. No significant canal
stenosis. Moderate left neural foraminal narrowing with possible
contact upon the undersurface of the exiting left L4 nerve root.
Borderline mild right neural foraminal narrowing. 3 mm posteriorly
projecting synovial facet cyst on the right.

At L5-S1, there is trace retrolisthesis. Central posterior annular
fissure. Mild disc bulge and endplate spurring. Minimal effacement
of the ventral thecal sac without significant subarticular or
central canal stenosis. No significant foraminal narrowing.

Mild disc degeneration and disc bulge at L3-L4.

L2-L3 central posterior annular fissure.

## 2020-12-13 ENCOUNTER — Ambulatory Visit: Payer: 59

## 2020-12-20 ENCOUNTER — Ambulatory Visit: Payer: 59 | Admitting: Family Medicine

## 2020-12-20 ENCOUNTER — Other Ambulatory Visit (HOSPITAL_COMMUNITY): Payer: Self-pay

## 2020-12-20 ENCOUNTER — Other Ambulatory Visit: Payer: Self-pay

## 2020-12-20 ENCOUNTER — Encounter: Payer: Self-pay | Admitting: Family Medicine

## 2020-12-20 VITALS — BP 152/80 | HR 75 | Temp 98.0°F | Ht 73.0 in | Wt 241.0 lb

## 2020-12-20 DIAGNOSIS — F5101 Primary insomnia: Secondary | ICD-10-CM | POA: Diagnosis not present

## 2020-12-20 DIAGNOSIS — Z23 Encounter for immunization: Secondary | ICD-10-CM | POA: Diagnosis not present

## 2020-12-20 DIAGNOSIS — I10 Essential (primary) hypertension: Secondary | ICD-10-CM

## 2020-12-20 DIAGNOSIS — G4700 Insomnia, unspecified: Secondary | ICD-10-CM | POA: Insufficient documentation

## 2020-12-20 DIAGNOSIS — G629 Polyneuropathy, unspecified: Secondary | ICD-10-CM | POA: Insufficient documentation

## 2020-12-20 DIAGNOSIS — R6882 Decreased libido: Secondary | ICD-10-CM | POA: Insufficient documentation

## 2020-12-20 MED ORDER — TRAZODONE HCL 50 MG PO TABS
50.0000 mg | ORAL_TABLET | Freq: Every evening | ORAL | 3 refills | Status: DC | PRN
  Filled 2020-12-20: qty 90, 45d supply, fill #0

## 2020-12-20 MED ORDER — AMLODIPINE BESYLATE 5 MG PO TABS
5.0000 mg | ORAL_TABLET | Freq: Every day | ORAL | 1 refills | Status: DC
  Filled 2020-12-20: qty 90, 90d supply, fill #0

## 2020-12-20 NOTE — Assessment & Plan Note (Signed)
Checking A1c and B12 levels today.  This may be related to his lumbar degenerative disc disease.

## 2020-12-20 NOTE — Progress Notes (Signed)
Matthew Daniel - 55 y.o. male MRN [id number]  Date of birth: 31-Jan-1966  Subjective Chief Complaint  Patient presents with   Hypertension   Labs Only    HPI Matthew Daniel is a 55 year old male here today for follow-up of hypertension.  He does have associated CKD 3 and is followed by nephrology.  He was on lisinopril previously however developed cough with this and was transitioned to losartan.  He did have increased creatinine after starting losartan and he has cut himself back to a half a tab.  His blood pressure is elevated today.  He denies any symptoms related to hypertension including chest pain, shortness of breath, palpitations, headaches or vision changes.  He is having difficulty with sleep.  He had previously taken Ambien but felt like he was becoming dependent on this.  May been helpful.  He would be interested in trying this again.  He has noted some burning pain on the top of his feet.  This is been present for several weeks.  He does have history of lumbar myelopathy.  He denies any weakness of the legs.  ROS:  A comprehensive ROS was completed and negative except as noted per HPI  Allergies  Allergen Reactions   Hydrocodone-Acetaminophen Nausea Only   Oxycodone Nausea And Vomiting and Rash   Sulfa Antibiotics Hives and Rash    Past Medical History:  Diagnosis Date   Diverticulitis    History of kidney stones     Past Surgical History:  Procedure Laterality Date   COLONOSCOPY     EYE SURGERY     INCISION AND DRAINAGE PERIRECTAL ABSCESS N/A 03/20/2014   Procedure: EXAM UNDER ANESTHESIA with SUPERFICIAL FISTULOTOMY WITH MARSUPIALIZATION;  Surgeon: Michael Boston, MD;  Location: [facility];  Service: General;  Laterality: N/A;   REFRACTIVE SURGERY      Social History   Socioeconomic History   Marital status: Single    Spouse name: Not on file   Number of children: Not on file   Years of education: Not on file   Highest education level: Not on file  Occupational  History   Occupation: Buyer, retail  Tobacco Use   Smoking status: Never   Smokeless tobacco: Never  Vaping Use   Vaping Use: Never used  Substance and Sexual Activity   Alcohol use: No    Alcohol/week: 0.0 standard drinks   Drug use: No   Sexual activity: Not Currently    Partners: Female  Other Topics Concern   Not on file  Social History Narrative   Not on file   Social Determinants of Health   Financial Resource Strain: Not on file  Food Insecurity: Not on file  Transportation Needs: Not on file  Physical Activity: Not on file  Stress: Not on file  Social Connections: Not on file    Family History  Problem Relation Age of Onset   Lung cancer Mother 67   Hypertension Brother    Hypertension Sister     Health Maintenance  Topic Date Due   COVID-19 Vaccine (4 - Booster for [place] series) 03/15/2020   Zoster Vaccines- Shingrix (2 of 2) 02/14/2021   TETANUS/TDAP  09/30/2023   COLONOSCOPY (Pts 45-74yrs Insurance coverage will need to be confirmed)  06/06/2026   INFLUENZA VACCINE  Completed   Hepatitis C Screening  Completed   HIV Screening  Completed   Pneumococcal Vaccine 63-46 Years old  Aged Out   HPV VACCINES  Aged Out     ----------------------------------------------------------------------------------------------------------------------------------------------------------------------------------------------------------------- Physical  Exam BP (!) 152/80 (BP Location: Left Arm, Patient Position: Sitting, Cuff Size: Large)   Pulse 75   Temp 98 F (36.7 C) (Oral)   Ht 6\' 1"  (1.854 m)   Wt 241 lb (109.3 kg)   SpO2 98%   BMI 31.80 kg/m   Physical Exam Constitutional:      Appearance: Normal appearance.  Eyes:     General: No scleral icterus. Cardiovascular:     Rate and Rhythm: Normal rate and regular rhythm.  Pulmonary:     Effort: Pulmonary effort is normal.     Breath sounds: Normal breath sounds.  Musculoskeletal:     Cervical back:  Neck supple.  Neurological:     Mental Status: He is alert.  Psychiatric:        Mood and Affect: Mood normal.        Behavior: Behavior normal.    ------------------------------------------------------------------------------------------------------------------------------------------------------------------------------------------------------------------- Assessment and Plan  Decreased libido Checking testosterone levels today.  Essential hypertension Blood pressures not well controlled at this time.  He will continue losartan at current strength and will add amlodipine 5 mg daily.  Low-sodium diet encouraged.  Neuropathy Checking A1c and B12 levels today.  This may be related to his lumbar degenerative disc disease.  Insomnia Adding trazodone 50 to 100 mg as needed at bedtime.   Meds ordered this encounter  Medications   DISCONTD: traZODone (DESYREL) 50 MG tablet    Sig: Take 1-2 tablets (50-100 mg total) by mouth at bedtime as needed for sleep.    Dispense:  90 tablet    Refill:  3   DISCONTD: amLODipine (NORVASC) 5 MG tablet    Sig: Take 1 tablet (5 mg total) by mouth daily.    Dispense:  90 tablet    Refill:  1   amLODipine (NORVASC) 5 MG tablet    Sig: Take 1 tablet (5 mg total) by mouth daily.    Dispense:  90 tablet    Refill:  1   traZODone (DESYREL) 50 MG tablet    Sig: Take 1-2 tablets (50-100 mg total) by mouth at bedtime as needed for sleep.    Dispense:  90 tablet    Refill:  3    Return in about 6 months (around 06/19/2021) for HTN.    This visit occurred during the SARS-CoV-2 public health emergency.  Safety protocols were in place, including screening questions prior to the visit, additional usage of staff PPE, and extensive cleaning of exam room while observing appropriate contact time as indicated for disinfecting solutions.

## 2020-12-20 NOTE — Assessment & Plan Note (Signed)
Adding trazodone 50 to 100 mg as needed at bedtime.

## 2020-12-20 NOTE — Patient Instructions (Addendum)
Great to see you today! Let's add amlodipine to help with blood pressure.  We'll be in touch with lab results.  Try trazodone 50-100mg  at bedtime for sleep.  Return in 1 month for BP check with nurse.  See me again in 6 months.

## 2020-12-20 NOTE — Assessment & Plan Note (Signed)
Blood pressures not well controlled at this time.  He will continue losartan at current strength and will add amlodipine 5 mg daily.  Low-sodium diet encouraged.

## 2020-12-20 NOTE — Assessment & Plan Note (Signed)
Checking testosterone levels today.

## 2020-12-21 ENCOUNTER — Other Ambulatory Visit: Payer: Self-pay | Admitting: Family Medicine

## 2020-12-21 ENCOUNTER — Other Ambulatory Visit (HOSPITAL_COMMUNITY): Payer: Self-pay

## 2020-12-21 ENCOUNTER — Encounter: Payer: Self-pay | Admitting: Family Medicine

## 2020-12-21 LAB — HEMOGLOBIN A1C
Hgb A1c MFr Bld: 5.7 % of total Hgb — ABNORMAL HIGH (ref <5.7)
Mean Plasma Glucose: 117 mg/dL
eAG (mmol/L): 6.5 mmol/L

## 2020-12-21 LAB — TESTOSTERONE: Testosterone: 441 ng/dL (ref 250–827)

## 2020-12-21 LAB — VITAMIN B12: Vitamin B-12: 406 pg/mL (ref 200–1100)

## 2020-12-21 MED ORDER — PREDNISONE 50 MG PO TABS
ORAL_TABLET | ORAL | 0 refills | Status: DC
  Filled 2020-12-21: qty 5, 5d supply, fill #0

## 2020-12-21 NOTE — Telephone Encounter (Signed)
Patient has been scheduled with Dr T on 12/27/2020. AM

## 2020-12-21 NOTE — Telephone Encounter (Signed)
Please schedule with Dr. Dianah Field for hip pain.   Thanks!  CM

## 2020-12-27 ENCOUNTER — Ambulatory Visit: Payer: 59 | Admitting: Sports Medicine

## 2021-01-17 ENCOUNTER — Other Ambulatory Visit (HOSPITAL_COMMUNITY): Payer: Self-pay

## 2021-01-17 ENCOUNTER — Ambulatory Visit (INDEPENDENT_AMBULATORY_CARE_PROVIDER_SITE_OTHER): Payer: 59 | Admitting: Family Medicine

## 2021-01-17 ENCOUNTER — Other Ambulatory Visit: Payer: Self-pay

## 2021-01-17 VITALS — BP 161/81 | HR 77

## 2021-01-17 DIAGNOSIS — I10 Essential (primary) hypertension: Secondary | ICD-10-CM

## 2021-01-17 NOTE — Progress Notes (Signed)
Medical screening examination/treatment was performed by qualified clinical staff member and as supervising physician I was immediately available for consultation/collaboration. I have reviewed documentation and agree with assessment and plan.  Recommend restarting losartan.  Follow-up in 2 to 3 weeks with me.  Luetta Nutting, DO

## 2021-01-17 NOTE — Progress Notes (Signed)
Established Patient Office Visit  Subjective:  Patient ID: Matthew Daniel, male    DOB: [birth year]  Age: 55 y.o. MRN: [id number]  CC:  Chief Complaint  Patient presents with   Blood Pressure Check    HPI Matthew Daniel presents a BP check. Pt denies chest pain, SOB, dizziness, or heart palpitations. Pt is taking amlodipine 5MG , pt stopped taking losartan 25MG . Denies medication side effects. First BP is 168/96, after sitting for 10 minutes BP is 161/81.  Past Medical History:  Diagnosis Date   Diverticulitis    History of kidney stones     Past Surgical History:  Procedure Laterality Date   COLONOSCOPY     EYE SURGERY     INCISION AND DRAINAGE PERIRECTAL ABSCESS N/A 03/20/2014   Procedure: EXAM UNDER ANESTHESIA with SUPERFICIAL FISTULOTOMY WITH MARSUPIALIZATION;  Surgeon: Michael Boston, MD;  Location: [facility];  Service: General;  Laterality: N/A;   REFRACTIVE SURGERY      Family History  Problem Relation Age of Onset   Lung cancer Mother 56   Hypertension Brother    Hypertension Sister     Social History   Socioeconomic History   Marital status: Single    Spouse name: Not on file   Number of children: Not on file   Years of education: Not on file   Highest education level: Not on file  Occupational History   Occupation: Buyer, retail  Tobacco Use   Smoking status: Never   Smokeless tobacco: Never  Vaping Use   Vaping Use: Never used  Substance and Sexual Activity   Alcohol use: No    Alcohol/week: 0.0 standard drinks   Drug use: No   Sexual activity: Not Currently    Partners: Female  Other Topics Concern   Not on file  Social History Narrative   Not on file   Social Determinants of Health   Financial Resource Strain: Not on file  Food Insecurity: Not on file  Transportation Needs: Not on file  Physical Activity: Not on file  Stress: Not on file  Social Connections: Not on file  Intimate Partner Violence: Not on file    Outpatient  Medications Prior to Visit  Medication Sig Dispense Refill   acetaminophen (TYLENOL) 500 MG tablet Take 1,000 mg by mouth every 6 (six) hours as needed.     amLODipine (NORVASC) 5 MG tablet Take 1 tablet (5 mg total) by mouth daily. 90 tablet 1   cholecalciferol (VITAMIN D3) 25 MCG (1000 UNIT) tablet Take 1,000 Units by mouth daily.     Multiple Vitamin (MULTIVITAMIN) tablet Take 0.5 tablets by mouth 2 (two) times daily.      Omega-3 Fatty Acids (OMEGA 3 PO) Take by mouth.     traZODone (DESYREL) 50 MG tablet Take 1-2 tablets (50-100 mg total) by mouth at bedtime as needed for sleep. 90 tablet 3   losartan (COZAAR) 25 MG tablet Take 1 tablet by mouth daily (Patient not taking: Reported on 01/17/2021) 30 tablet 6   predniSONE (DELTASONE) 50 MG tablet Take 1 tabley by mouth daily for 5 days 5 tablet 0   No facility-administered medications prior to visit.    Allergies  Allergen Reactions   Hydrocodone-Acetaminophen Nausea Only   Oxycodone Nausea And Vomiting and Rash   Sulfa Antibiotics Hives and Rash    ROS Review of Systems    Objective:    Physical Exam  BP (!) 168/96 (BP Location: Left Arm, Patient Position: Sitting, Cuff Size:  Large)   Pulse 77   SpO2 98%  Wt Readings from Last 3 Encounters:  12/20/20 241 lb (109.3 kg)  05/18/20 242 lb (109.8 kg)  12/09/19 239 lb 14.4 oz (108.8 kg)     Health Maintenance Due  Topic Date Due   COVID-19 Vaccine (4 - Booster for [employer] series) 03/15/2020    There are no preventive care reminders to display for this patient.  Lab Results  Component Value Date   TSH 1.30 05/18/2020   Lab Results  Component Value Date   WBC 3.7 (L) 05/18/2020   HGB 13.9 05/18/2020   HCT 41.1 05/18/2020   MCV 88.2 05/18/2020   PLT 228 05/18/2020   Lab Results  Component Value Date   NA 140 05/18/2020   K 4.2 05/18/2020   CO2 26 05/18/2020   GLUCOSE 86 05/18/2020   BUN 14 05/18/2020   CREATININE 1.44 (H) 05/18/2020   BILITOT 0.9 05/18/2020    ALKPHOS 53 03/23/2019   AST 23 05/18/2020   ALT 23 05/18/2020   PROT 7.2 05/18/2020   ALBUMIN 3.8 03/23/2019   CALCIUM 9.4 05/18/2020   ANIONGAP 7 03/24/2019   Lab Results  Component Value Date   CHOL 171 05/18/2020   Lab Results  Component Value Date   HDL 47 05/18/2020   Lab Results  Component Value Date   LDLCALC 109 (H) 05/18/2020   Lab Results  Component Value Date   TRIG 67 05/18/2020   Lab Results  Component Value Date   CHOLHDL 3.6 05/18/2020   Lab Results  Component Value Date   HGBA1C 5.7 (H) 12/20/2020      Assessment & Plan:  Continue amlodipine 5MG , restart losartan 25MG . Follow up with PCP in 2 weeks.  Problem List Items Addressed This Visit       Cardiovascular and Mediastinum   Essential hypertension - Primary    No orders of the defined types were placed in this encounter.   Follow-up: Return in about 2 weeks (around 01/31/2021) for office visit with PCP.    Ninfa Meeker, CMA

## 2021-01-31 ENCOUNTER — Encounter: Payer: Self-pay | Admitting: Family Medicine

## 2021-01-31 ENCOUNTER — Other Ambulatory Visit: Payer: Self-pay

## 2021-01-31 ENCOUNTER — Ambulatory Visit (INDEPENDENT_AMBULATORY_CARE_PROVIDER_SITE_OTHER): Payer: 59 | Admitting: Family Medicine

## 2021-01-31 ENCOUNTER — Other Ambulatory Visit (HOSPITAL_COMMUNITY): Payer: Self-pay

## 2021-01-31 VITALS — BP 189/82 | HR 85 | Resp 20 | Ht 73.0 in

## 2021-01-31 DIAGNOSIS — N529 Male erectile dysfunction, unspecified: Secondary | ICD-10-CM

## 2021-01-31 DIAGNOSIS — I10 Essential (primary) hypertension: Secondary | ICD-10-CM | POA: Diagnosis not present

## 2021-01-31 MED ORDER — SILDENAFIL CITRATE 100 MG PO TABS
50.0000 mg | ORAL_TABLET | Freq: Every day | ORAL | 3 refills | Status: DC | PRN
Start: 2021-01-31 — End: 2021-09-19
  Filled 2021-01-31: qty 6, 30d supply, fill #0

## 2021-01-31 MED ORDER — AMLODIPINE BESYLATE 10 MG PO TABS
10.0000 mg | ORAL_TABLET | Freq: Every day | ORAL | 1 refills | Status: DC
  Filled 2021-01-31: qty 90, 90d supply, fill #0

## 2021-01-31 MED ORDER — LOSARTAN POTASSIUM 50 MG PO TABS
50.0000 mg | ORAL_TABLET | Freq: Every day | ORAL | 1 refills | Status: DC
  Filled 2021-01-31: qty 90, 90d supply, fill #0

## 2021-01-31 NOTE — Progress Notes (Signed)
Matthew Daniel - 55 y.o. male MRN [id number]  Date of birth: 1965-08-15  Subjective Chief Complaint  Patient presents with   Hypertension    HPI Matthew Daniel is a 55 year old male here today for follow-up of hypertension.  Reports he is doing well with current medications however blood pressure remains elevated.  He has had mild intermittent headache.  Denies chest pain, shortness of breath or vision changes.    Also interested in adding sildenafil.  Has had some difficulty with maintaining erection.  ROS:  A comprehensive ROS was completed and negative except as noted per HPI  Allergies  Allergen Reactions   Hydrocodone-Acetaminophen Nausea Only   Oxycodone Nausea And Vomiting and Rash   Sulfa Antibiotics Hives and Rash    Past Medical History:  Diagnosis Date   Diverticulitis    History of kidney stones     Past Surgical History:  Procedure Laterality Date   COLONOSCOPY     EYE SURGERY     INCISION AND DRAINAGE PERIRECTAL ABSCESS N/A 03/20/2014   Procedure: EXAM UNDER ANESTHESIA with SUPERFICIAL FISTULOTOMY WITH MARSUPIALIZATION;  Surgeon: Michael Boston, MD;  Location: [facility];  Service: General;  Laterality: N/A;   REFRACTIVE SURGERY      Social History   Socioeconomic History   Marital status: Single    Spouse name: Not on file   Number of children: Not on file   Years of education: Not on file   Highest education level: Not on file  Occupational History   Occupation: Buyer, retail  Tobacco Use   Smoking status: Never   Smokeless tobacco: Never  Vaping Use   Vaping Use: Never used  Substance and Sexual Activity   Alcohol use: No    Alcohol/week: 0.0 standard drinks   Drug use: No   Sexual activity: Not Currently    Partners: Female  Other Topics Concern   Not on file  Social History Narrative   Not on file   Social Determinants of Health   Financial Resource Strain: Not on file  Food Insecurity: Not on file  Transportation Needs: Not on file   Physical Activity: Not on file  Stress: Not on file  Social Connections: Not on file    Family History  Problem Relation Age of Onset   Lung cancer Mother 64   Hypertension Brother    Hypertension Sister     Health Maintenance  Topic Date Due   COVID-19 Vaccine (4 - Booster for [place] series) 02/16/2021 (Originally 03/15/2020)   Zoster Vaccines- Shingrix (2 of 2) 02/14/2021   TETANUS/TDAP  09/30/2023   COLONOSCOPY (Pts 45-32yrs Insurance coverage will need to be confirmed)  06/06/2026   INFLUENZA VACCINE  Completed   Hepatitis C Screening  Completed   HIV Screening  Completed   Pneumococcal Vaccine 60-11 Years old  Aged Out   HPV VACCINES  Aged Out     ----------------------------------------------------------------------------------------------------------------------------------------------------------------------------------------------------------------- Physical Exam BP (!) 189/82 (BP Location: Left Arm, Patient Position: Sitting, Cuff Size: Large)    Pulse 85    Resp 20    Ht 6\' 1"  (1.854 m)    SpO2 98%    BMI 31.80 kg/m   Physical Exam Constitutional:      Appearance: Normal appearance.  Eyes:     General: No scleral icterus. Cardiovascular:     Rate and Rhythm: Normal rate and regular rhythm.  Pulmonary:     Effort: Pulmonary effort is normal.     Breath sounds: Normal breath sounds.  Musculoskeletal:  Cervical back: Neck supple.  Neurological:     Mental Status: He is alert.  Psychiatric:        Mood and Affect: Mood normal.        Behavior: Behavior normal.    ------------------------------------------------------------------------------------------------------------------------------------------------------------------------------------------------------------------- Assessment and Plan  Essential hypertension Blood pressure remains elevated today.  Increasing amlodipine to 10 mg as well as losartan to 50 mg.  Return in 1 month for blood pressure  recheck.  We will recheck renal function at that time as well.  Erectile dysfunction Adding sildenafil 50 to 100 mg as needed.  Discussed potential side effects related to medication including headaches and nasal congestion.  Red flags discussed including prolonged erection and avoidance of nitrates   Meds ordered this encounter  Medications   amLODipine (NORVASC) 10 MG tablet    Sig: Take 1 tablet (10 mg total) by mouth daily.    Dispense:  90 tablet    Refill:  1   losartan (COZAAR) 50 MG tablet    Sig: Take 1 tablet (50 mg total) by mouth daily.    Dispense:  90 tablet    Refill:  1   sildenafil (VIAGRA) 100 MG tablet    Sig: Take 0.5-1 tablets (50-100 mg total) by mouth daily as needed for erectile dysfunction.    Dispense:  10 tablet    Refill:  3    Return in about 4 weeks (around 02/28/2021) for HTN.    This visit occurred during the SARS-CoV-2 public health emergency.  Safety protocols were in place, including screening questions prior to the visit, additional usage of staff PPE, and extensive cleaning of exam room while observing appropriate contact time as indicated for disinfecting solutions.

## 2021-01-31 NOTE — Assessment & Plan Note (Signed)
Adding sildenafil 50 to 100 mg as needed.  Discussed potential side effects related to medication including headaches and nasal congestion.  Red flags discussed including prolonged erection and avoidance of nitrates

## 2021-01-31 NOTE — Assessment & Plan Note (Signed)
Blood pressure remains elevated today.  Increasing amlodipine to 10 mg as well as losartan to 50 mg.  Return in 1 month for blood pressure recheck.  We will recheck renal function at that time as well.

## 2021-01-31 NOTE — Patient Instructions (Signed)
Increase amlodipine to 10mg  daily. Losartan to 50mg  daily.  Follow up in 1 month.

## 2021-02-28 ENCOUNTER — Other Ambulatory Visit: Payer: Self-pay

## 2021-02-28 ENCOUNTER — Other Ambulatory Visit (HOSPITAL_COMMUNITY): Payer: Self-pay

## 2021-02-28 ENCOUNTER — Ambulatory Visit: Payer: 59 | Admitting: Family Medicine

## 2021-02-28 ENCOUNTER — Ambulatory Visit: Payer: 59 | Admitting: Sports Medicine

## 2021-02-28 DIAGNOSIS — G609 Hereditary and idiopathic neuropathy, unspecified: Secondary | ICD-10-CM | POA: Diagnosis not present

## 2021-02-28 DIAGNOSIS — G629 Polyneuropathy, unspecified: Secondary | ICD-10-CM | POA: Insufficient documentation

## 2021-02-28 DIAGNOSIS — M5136 Other intervertebral disc degeneration, lumbar region: Secondary | ICD-10-CM

## 2021-02-28 DIAGNOSIS — M51369 Other intervertebral disc degeneration, lumbar region without mention of lumbar back pain or lower extremity pain: Secondary | ICD-10-CM

## 2021-02-28 MED ORDER — ACETAMINOPHEN ER 650 MG PO TBCR
650.0000 mg | EXTENDED_RELEASE_TABLET | Freq: Three times a day (TID) | ORAL | 3 refills | Status: AC | PRN
  Filled 2021-02-28: qty 90, 30d supply, fill #0

## 2021-02-28 NOTE — Assessment & Plan Note (Addendum)
This is a pleasant 56 year old male, lumbar DDD noted, axial low back pain, relatively chronic. Worse with sitting, flexion, Valsalva. We discussed evolution and anthropology of low back pain, adding formal physical therapy, arthritis from Tylenol. He will work with his PCP on weight loss. Is also complaining of paresthesias in the lower extremities, I see only trace areas of neuroforaminal stenosis on his MRI so we will pull the trigger for nerve conduction and EMG to evaluate for metabolic peripheral neuropathy.

## 2021-02-28 NOTE — Assessment & Plan Note (Addendum)
Mackie is having numbness and tingling in both feet, I do not see any obvious areas of neuroforaminal stenosis on the MRI, so I would like nerve conduction and EMG to to further evaluate this.  Update: [facility] does not have an EMG/NCS tech, switching referral to [facility].

## 2021-02-28 NOTE — Progress Notes (Addendum)
° ° °  Procedures performed today:    None.  Independent interpretation of notes and tests performed by another provider:   Lumbar spine MRI personally reviewed, there is desiccation and a slight protrusion L5-S1 but no obvious/trace neuroforaminal stenosis or compression.  Brief History, Exam, Impression, and Recommendations:    Lumbar degenerative disc disease This is a pleasant 56 year old male, lumbar DDD noted, axial low back pain, relatively chronic. Worse with sitting, flexion, Valsalva. We discussed evolution and anthropology of low back pain, adding formal physical therapy, arthritis from Tylenol. He will work with his PCP on weight loss. Is also complaining of paresthesias in the lower extremities, I see only trace areas of neuroforaminal stenosis on his MRI so we will pull the trigger for nerve conduction and EMG to evaluate for metabolic peripheral neuropathy.  Peripheral neuropathy Matthew Daniel is having numbness and tingling in both feet, I do not see any obvious areas of neuroforaminal stenosis on the MRI, so I would like nerve conduction and EMG to to further evaluate this.  Update: [facility] does not have an EMG/NCS tech, switching referral to [facility].    ___________________________________________ Gwen Her. Dianah Field, M.D., ABFM., CAQSM. Primary Care and [place] Instructor of [employer] [facility] [employer] [facility]

## 2021-03-14 NOTE — Therapy (Incomplete)
OUTPATIENT PHYSICAL THERAPY THORACOLUMBAR EVALUATION   Patient Name: Matthew Daniel MRN: [id number] DOB:10/07/1965, 56 y.o., male Today's Date: 03/14/2021    Past Medical History:  Diagnosis Date   Diverticulitis    History of kidney stones    Past Surgical History:  Procedure Laterality Date   COLONOSCOPY     EYE SURGERY     INCISION AND DRAINAGE PERIRECTAL ABSCESS N/A 03/20/2014   Procedure: EXAM UNDER ANESTHESIA with SUPERFICIAL FISTULOTOMY WITH MARSUPIALIZATION;  Surgeon: Michael Boston, MD;  Location: [facility];  Service: General;  Laterality: N/A;   REFRACTIVE SURGERY     Patient Active Problem List   Diagnosis Date Noted   Peripheral neuropathy 02/28/2021   Erectile dysfunction 01/31/2021   Neuropathy 12/20/2020   Decreased libido 12/20/2020   Essential hypertension 12/20/2020   Insomnia 12/20/2020   Well adult exam 05/20/2020   Lumbar degenerative disc disease 12/11/2019   CKD (chronic kidney disease), stage III ([place]) 10/08/2017   Diverticulosis of large intestine without hemorrhage 10/16/2016   History of renal stone 12/15/2014   History of colonic polyps 12/15/2014   History of hemorrhoids 12/15/2014   Gastroesophageal reflux disease without esophagitis 12/15/2014   History of diverticulitis 12/15/2014    PCP: Luetta Nutting, DO  REFERRING PROVIDER: Silverio Decamp,*  REFERRING DIAG: M51.36 (ICD-10-CM) - Lumbar degenerative disc disease  THERAPY DIAG:  No diagnosis found.  ONSET DATE: ***  SUBJECTIVE:                                                                                                                                                                                           SUBJECTIVE STATEMENT: *** PERTINENT HISTORY:  ***  PAIN:  Are you having pain? {yes/no:20286} NPRS scale: ***/10 Pain location: *** Pain orientation: {Pain Orientation:25161}  PAIN TYPE: {type:313116} Pain description: {PAIN DESCRIPTION:21022940}   Aggravating factors: *** Relieving factors: ***  PRECAUTIONS: {Therapy precautions:24002}  WEIGHT BEARING RESTRICTIONS {Yes ***/No:24003}  FALLS:  Has patient fallen in last 6 months? {yes/no:20286}, Number of falls: ***  LIVING ENVIRONMENT: Lives with: {[facility] lives with:25569::"lives with their family"} Lives in: {Lives in:25570} Stairs: {yes/no:20286}; {Stairs:24000} Has following equipment at home: {Assistive devices:23999}  OCCUPATION: ***  PLOF: {PLOF:24004}  PATIENT GOALS ***   OBJECTIVE:   DIAGNOSTIC FINDINGS:  MRI lumbar spine without contrast 12/16/2020:   IMPRESSION: Lumbar spondylosis as outlined with findings most notably as follows.   At L4-L5, there is mild disc degeneration. Small disc bulge with endplate spurring. Superimposed broad-based left subarticular/foraminal disc protrusion. No significant canal stenosis. Moderate left neural foraminal narrowing with possible contact upon the undersurface of the exiting left L4 nerve root.  Borderline mild right neural foraminal narrowing. 3 mm posteriorly projecting synovial facet cyst on the right.   At L5-S1, there is trace retrolisthesis. Central posterior annular fissure. Mild disc bulge and endplate spurring. Minimal effacement of the ventral thecal sac without significant subarticular or central canal stenosis. No significant foraminal narrowing.   Mild disc degeneration and disc bulge at L3-L4.   L2-L3 central posterior annular fissure.  PATIENT SURVEYS:  FOTO ***  SCREENING FOR RED FLAGS: Bowel or bladder incontinence: {Yes/No:304960894} Cauda equina syndrome: {Yes/No:304960894}  COGNITION:  Overall cognitive status: {cognition:24006}     SENSATION:  Light touch: {intact/deficits:24005}   MUSCLE LENGTH: Hamstrings: Right *** deg; Left *** deg Thomas test: Right *** deg; Left *** deg  POSTURE:  ***  PALPATION: ***  LUMBAR AROM  ROM AROM  03/14/2021  Flexion   Extension   Right  lateral flexion   Left lateral flexion   Right rotation   Left rotation    (Blank rows = not tested)  LE MMT:  MMT Right 03/14/2021 Left 03/14/2021  Hip flexion    Hip extension    Hip abduction    Knee flexion    Knee extension    Ankle dorsiflexion    Ankle plantarflexion     (Blank rows = not tested)  LUMBAR SPECIAL TESTS:  {lumbar special test:25242}  FUNCTIONAL TESTS:  {Functional tests:24029}  GAIT: Distance walked: *** Assistive device utilized: {Assistive devices:23999} Level of assistance: {Levels of assistance:24026} Comments: ***    TODAY'S TREATMENT  [facility] Adult PT Treatment:                                                DATE: 03/15/2021 Therapeutic Exercise: *** Manual Therapy: *** Neuromuscular re-ed: *** Therapeutic Activity: *** Modalities: *** Self Care: ***    PATIENT EDUCATION:  Education details: *** Person educated: {Person educated:25204} Education method: {Education Method:25205} Education comprehension: {Education Comprehension:25206}   HOME EXERCISE PROGRAM: ***  ASSESSMENT:  CLINICAL IMPRESSION: Patient is a *** y.o. *** who was seen today for physical therapy evaluation and treatment for ***. Objective impairments include {opptimpairments:25111}. These impairments are limiting patient from {activity limitations:25113}. Personal factors including {Personal factors:25162} are also affecting patient's functional outcome. Patient will benefit from skilled PT to address above impairments and improve overall function.  REHAB POTENTIAL: {rehabpotential:25112}  CLINICAL DECISION MAKING: {clinical decision making:25114}  EVALUATION COMPLEXITY: {Evaluation complexity:25115}   GOALS: Goals reviewed with patient? {yes/no:20286}  SHORT TERM GOALS:  STG Name Target Date Goal status  1 *** Baseline:  {follow up:25551} {GOALSTATUS:25110}  2 *** Baseline:  {follow up:25551} {GOALSTATUS:25110}  3 *** Baseline: {follow up:25551}  {GOALSTATUS:25110}  4 *** Baseline: {follow up:25551} {GOALSTATUS:25110}  5 *** Baseline: {follow up:25551} {GOALSTATUS:25110}  6 *** Baseline: {follow up:25551} {GOALSTATUS:25110}  7 *** Baseline: {follow up:25551} {GOALSTATUS:25110}   LONG TERM GOALS:   LTG Name Target Date Goal status  1 *** Baseline: {follow up:25551} {GOALSTATUS:25110}  2 *** Baseline: {follow up:25551} {GOALSTATUS:25110}  3 *** Baseline: {follow up:25551} {GOALSTATUS:25110}  4 *** Baseline: {follow up:25551} {GOALSTATUS:25110}  5 *** Baseline: {follow up:25551} {GOALSTATUS:25110}  6 *** Baseline: {follow up:25551} {GOALSTATUS:25110}  7 *** Baseline: {follow up:25551} {GOALSTATUS:25110}   PLAN: PT FREQUENCY: {rehab frequency:25116}  PT DURATION: {rehab duration:25117}  PLANNED INTERVENTIONS: {rehab planned interventions:25118::"Therapeutic exercises","Therapeutic activity","Neuro Muscular re-education","Balance training","Gait training","Patient/Family education","Joint mobilization"}  PLAN FOR NEXT SESSION: ***  Vanessa Elmira, PT, DPT 03/14/21 9:48 AM

## 2021-03-15 ENCOUNTER — Ambulatory Visit: Payer: 59 | Attending: Sports Medicine

## 2021-03-21 ENCOUNTER — Ambulatory Visit (INDEPENDENT_AMBULATORY_CARE_PROVIDER_SITE_OTHER): Payer: 59 | Admitting: Family Medicine

## 2021-03-21 ENCOUNTER — Encounter: Payer: Self-pay | Admitting: Family Medicine

## 2021-03-21 ENCOUNTER — Other Ambulatory Visit: Payer: Self-pay

## 2021-03-21 ENCOUNTER — Other Ambulatory Visit (HOSPITAL_COMMUNITY): Payer: Self-pay

## 2021-03-21 VITALS — BP 138/81 | HR 76 | Temp 98.4°F | Ht 73.0 in | Wt 235.0 lb

## 2021-03-21 DIAGNOSIS — I10 Essential (primary) hypertension: Secondary | ICD-10-CM

## 2021-03-21 DIAGNOSIS — N1831 Chronic kidney disease, stage 3a: Secondary | ICD-10-CM | POA: Diagnosis not present

## 2021-03-21 DIAGNOSIS — G629 Polyneuropathy, unspecified: Secondary | ICD-10-CM | POA: Diagnosis not present

## 2021-03-21 DIAGNOSIS — Z23 Encounter for immunization: Secondary | ICD-10-CM

## 2021-03-21 MED ORDER — AMLODIPINE BESYLATE 10 MG PO TABS
10.0000 mg | ORAL_TABLET | Freq: Every day | ORAL | 1 refills | Status: DC
  Filled 2021-03-21 – 2021-05-03 (×3): qty 90, 90d supply, fill #0

## 2021-03-21 MED ORDER — LOSARTAN POTASSIUM 50 MG PO TABS
50.0000 mg | ORAL_TABLET | Freq: Every day | ORAL | 1 refills | Status: DC
  Filled 2021-03-21 – 2021-05-03 (×3): qty 90, 90d supply, fill #0
  Filled 2021-09-13: qty 90, 90d supply, fill #1

## 2021-03-21 NOTE — Assessment & Plan Note (Signed)
Recommend trial of adding alpha lipoic acid.  Has upcoming visit with neurology.  Anticipate he will have a nerve conduction study.

## 2021-03-21 NOTE — Patient Instructions (Addendum)
Weight: 244-->235lbs Try adding alpha lipoic acid 600mg  daily

## 2021-03-21 NOTE — Assessment & Plan Note (Signed)
Blood pressure remains well controlled.  Recommend continuation of amlodipine and losartan at current strength.

## 2021-03-21 NOTE — Progress Notes (Signed)
Matthew Daniel - 56 y.o. male MRN [id number]  Date of birth: [birth year]  Subjective Chief Complaint  Patient presents with   Hypertension    HPI Matthew Daniel is a 56 year old male here today for follow-up visit.  Reports he is doing well at this time.  Blood pressures remain well controlled with combination of losartan and amlodipine.  He has not noted any significant side effects from medication.  He denies chest pain, shortness of breath, palpitations, headaches or vision changes.    Continues have difficulty with neuropathy.  He does have an upcoming visit with neurology in a couple weeks.  ROS:  A comprehensive ROS was completed and negative except as noted per HPI  Allergies  Allergen Reactions   Hydrocodone-Acetaminophen Nausea Only   Oxycodone Nausea And Vomiting and Rash   Sulfa Antibiotics Hives and Rash    Past Medical History:  Diagnosis Date   Diverticulitis    History of kidney stones     Past Surgical History:  Procedure Laterality Date   COLONOSCOPY     EYE SURGERY     INCISION AND DRAINAGE PERIRECTAL ABSCESS N/A 03/20/2014   Procedure: EXAM UNDER ANESTHESIA with SUPERFICIAL FISTULOTOMY WITH MARSUPIALIZATION;  Surgeon: Michael Boston, MD;  Location: [facility];  Service: General;  Laterality: N/A;   REFRACTIVE SURGERY      Social History   Socioeconomic History   Marital status: Single    Spouse name: Not on file   Number of children: Not on file   Years of education: Not on file   Highest education level: Not on file  Occupational History   Occupation: Buyer, retail  Tobacco Use   Smoking status: Never   Smokeless tobacco: Never  Vaping Use   Vaping Use: Never used  Substance and Sexual Activity   Alcohol use: No    Alcohol/week: 0.0 standard drinks   Drug use: No   Sexual activity: Not Currently    Partners: Female  Other Topics Concern   Not on file  Social History Narrative   Not on file   Social Determinants of Health   Financial  Resource Strain: Not on file  Food Insecurity: Not on file  Transportation Needs: Not on file  Physical Activity: Not on file  Stress: Not on file  Social Connections: Not on file    Family History  Problem Relation Age of Onset   Lung cancer Mother 29   Hypertension Brother    Hypertension Sister     Health Maintenance  Topic Date Due   COVID-19 Vaccine (4 - [place] series) 03/15/2020   TETANUS/TDAP  09/30/2023   COLONOSCOPY (Pts 45-34yrs Insurance coverage will need to be confirmed)  06/06/2026   INFLUENZA VACCINE  Completed   Hepatitis C Screening  Completed   HIV Screening  Completed   Zoster Vaccines- Shingrix  Completed   HPV VACCINES  Aged Out     ----------------------------------------------------------------------------------------------------------------------------------------------------------------------------------------------------------------- Physical Exam BP 138/81 (BP Location: Left Arm, Patient Position: Sitting, Cuff Size: Large)    Pulse 76    Temp 98.4 F (36.9 C)    Ht 6\' 1"  (1.854 m)    Wt 235 lb (106.6 kg)    SpO2 100%    BMI 31.00 kg/m   Physical Exam Constitutional:      Appearance: Normal appearance.  Eyes:     General: No scleral icterus. Musculoskeletal:     Cervical back: Neck supple.  Neurological:     Mental Status: He is alert.  Psychiatric:        Mood and Affect: Mood normal.        Behavior: Behavior normal.    ------------------------------------------------------------------------------------------------------------------------------------------------------------------------------------------------------------------- Assessment and Plan  Essential hypertension Blood pressure remains well controlled.  Recommend continuation of amlodipine and losartan at current strength.  Neuropathy Recommend trial of adding alpha lipoic acid.  Has upcoming visit with neurology.  Anticipate he will have a nerve conduction  study.   Meds ordered this encounter  Medications   losartan (COZAAR) 50 MG tablet    Sig: Take 1 tablet by mouth daily.    Dispense:  90 tablet    Refill:  1   amLODipine (NORVASC) 10 MG tablet    Sig: Take 1 tablet by mouth daily.    Dispense:  90 tablet    Refill:  1    Return in about 6 months (around 09/18/2021) for HTN.    This visit occurred during the SARS-CoV-2 public health emergency.  Safety protocols were in place, including screening questions prior to the visit, additional usage of staff PPE, and extensive cleaning of exam room while observing appropriate contact time as indicated for disinfecting solutions.

## 2021-03-22 LAB — RENAL FUNCTION PANEL
Albumin: 4.6 g/dL (ref 3.6–5.1)
BUN/Creatinine Ratio: 11 (calc) (ref 6–22)
BUN: 17 mg/dL (ref 7–25)
CO2: 26 mmol/L (ref 20–32)
Calcium: 9.3 mg/dL (ref 8.6–10.3)
Chloride: 107 mmol/L (ref 98–110)
Creat: 1.53 mg/dL — ABNORMAL HIGH (ref 0.70–1.30)
Glucose, Bld: 92 mg/dL (ref 65–99)
Phosphorus: 2.8 mg/dL (ref 2.5–4.5)
Potassium: 4.9 mmol/L (ref 3.5–5.3)
Sodium: 141 mmol/L (ref 135–146)

## 2021-04-11 ENCOUNTER — Ambulatory Visit: Payer: 59 | Admitting: Sports Medicine

## 2021-04-18 ENCOUNTER — Ambulatory Visit: Payer: 59 | Admitting: Neurology

## 2021-04-18 NOTE — Addendum Note (Signed)
Addended by: Silverio Decamp on: 04/18/2021 10:39 AM   Modules accepted: Orders

## 2021-04-25 ENCOUNTER — Ambulatory Visit: Payer: 59 | Admitting: Sports Medicine

## 2021-05-02 ENCOUNTER — Other Ambulatory Visit (HOSPITAL_COMMUNITY): Payer: Self-pay

## 2021-05-03 ENCOUNTER — Other Ambulatory Visit (HOSPITAL_COMMUNITY): Payer: Self-pay

## 2021-06-06 ENCOUNTER — Other Ambulatory Visit: Payer: Self-pay

## 2021-06-06 DIAGNOSIS — M5417 Radiculopathy, lumbosacral region: Secondary | ICD-10-CM | POA: Diagnosis not present

## 2021-06-06 DIAGNOSIS — R202 Paresthesia of skin: Secondary | ICD-10-CM | POA: Diagnosis not present

## 2021-06-06 DIAGNOSIS — G603 Idiopathic progressive neuropathy: Secondary | ICD-10-CM | POA: Diagnosis not present

## 2021-06-06 DIAGNOSIS — R519 Headache, unspecified: Secondary | ICD-10-CM | POA: Diagnosis not present

## 2021-06-06 DIAGNOSIS — G609 Hereditary and idiopathic neuropathy, unspecified: Secondary | ICD-10-CM | POA: Diagnosis not present

## 2021-06-06 DIAGNOSIS — R634 Abnormal weight loss: Secondary | ICD-10-CM | POA: Diagnosis not present

## 2021-06-06 MED ORDER — NURTEC 75 MG PO TBDP
ORAL_TABLET | ORAL | 6 refills | Status: DC
  Filled 2021-06-06: qty 16, 16d supply, fill #0

## 2021-06-06 MED ORDER — PREGABALIN 100 MG PO CAPS
ORAL_CAPSULE | ORAL | 4 refills | Status: DC
  Filled 2021-06-06: qty 90, 30d supply, fill #0

## 2021-06-10 ENCOUNTER — Other Ambulatory Visit: Payer: Self-pay

## 2021-06-11 ENCOUNTER — Other Ambulatory Visit: Payer: Self-pay

## 2021-06-18 ENCOUNTER — Other Ambulatory Visit: Payer: Self-pay

## 2021-06-20 ENCOUNTER — Ambulatory Visit: Payer: 59 | Admitting: Family Medicine

## 2021-06-21 DIAGNOSIS — E559 Vitamin D deficiency, unspecified: Secondary | ICD-10-CM | POA: Diagnosis not present

## 2021-06-21 DIAGNOSIS — I129 Hypertensive chronic kidney disease with stage 1 through stage 4 chronic kidney disease, or unspecified chronic kidney disease: Secondary | ICD-10-CM | POA: Diagnosis not present

## 2021-06-21 DIAGNOSIS — N1831 Chronic kidney disease, stage 3a: Secondary | ICD-10-CM | POA: Diagnosis not present

## 2021-07-04 ENCOUNTER — Other Ambulatory Visit: Payer: Self-pay

## 2021-07-04 ENCOUNTER — Other Ambulatory Visit (HOSPITAL_COMMUNITY): Payer: Self-pay

## 2021-07-04 DIAGNOSIS — G6289 Other specified polyneuropathies: Secondary | ICD-10-CM | POA: Diagnosis not present

## 2021-07-04 DIAGNOSIS — R208 Other disturbances of skin sensation: Secondary | ICD-10-CM | POA: Diagnosis not present

## 2021-07-04 DIAGNOSIS — M5417 Radiculopathy, lumbosacral region: Secondary | ICD-10-CM | POA: Diagnosis not present

## 2021-07-04 DIAGNOSIS — Z760 Encounter for issue of repeat prescription: Secondary | ICD-10-CM | POA: Diagnosis not present

## 2021-07-04 MED ORDER — THIAMINE HCL 100 MG PO TABS
ORAL_TABLET | Freq: Two times a day (BID) | ORAL | 6 refills | Status: DC
  Filled 2021-07-04: qty 60, 30d supply, fill #0

## 2021-08-14 ENCOUNTER — Other Ambulatory Visit (HOSPITAL_COMMUNITY): Payer: Self-pay

## 2021-08-14 DIAGNOSIS — R8271 Bacteriuria: Secondary | ICD-10-CM | POA: Diagnosis not present

## 2021-08-14 DIAGNOSIS — R8281 Pyuria: Secondary | ICD-10-CM | POA: Diagnosis not present

## 2021-08-14 DIAGNOSIS — R3121 Asymptomatic microscopic hematuria: Secondary | ICD-10-CM | POA: Diagnosis not present

## 2021-08-14 MED ORDER — AMOXICILLIN-POT CLAVULANATE 500-125 MG PO TABS
ORAL_TABLET | ORAL | 0 refills | Status: DC
  Filled 2021-08-14 (×2): qty 14, 7d supply, fill #0

## 2021-08-14 MED ORDER — AMOXICILLIN-POT CLAVULANATE 500-125 MG PO TABS
1.0000 | ORAL_TABLET | Freq: Two times a day (BID) | ORAL | 0 refills | Status: DC
  Filled 2021-08-14: qty 14, 7d supply, fill #0

## 2021-09-13 ENCOUNTER — Other Ambulatory Visit (HOSPITAL_COMMUNITY): Payer: Self-pay

## 2021-09-19 ENCOUNTER — Other Ambulatory Visit (HOSPITAL_COMMUNITY): Payer: Self-pay

## 2021-09-19 ENCOUNTER — Encounter: Payer: Self-pay | Admitting: Family Medicine

## 2021-09-19 ENCOUNTER — Ambulatory Visit: Payer: 59 | Admitting: Family Medicine

## 2021-09-19 VITALS — BP 144/72 | HR 74 | Ht 73.0 in | Wt 224.0 lb

## 2021-09-19 DIAGNOSIS — G43709 Chronic migraine without aura, not intractable, without status migrainosus: Secondary | ICD-10-CM | POA: Diagnosis not present

## 2021-09-19 DIAGNOSIS — F5101 Primary insomnia: Secondary | ICD-10-CM

## 2021-09-19 DIAGNOSIS — Z13220 Encounter for screening for lipoid disorders: Secondary | ICD-10-CM

## 2021-09-19 DIAGNOSIS — I10 Essential (primary) hypertension: Secondary | ICD-10-CM

## 2021-09-19 DIAGNOSIS — G629 Polyneuropathy, unspecified: Secondary | ICD-10-CM | POA: Diagnosis not present

## 2021-09-19 MED ORDER — NURTEC 75 MG PO TBDP
ORAL_TABLET | ORAL | 6 refills | Status: DC
Start: 2021-09-19 — End: 2022-06-23
  Filled 2021-09-19: qty 10, 30d supply, fill #0

## 2021-09-19 MED ORDER — PREGABALIN 100 MG PO CAPS
ORAL_CAPSULE | ORAL | 4 refills | Status: DC
  Filled 2021-09-19: qty 90, 30d supply, fill #0

## 2021-09-19 MED ORDER — TRAZODONE HCL 50 MG PO TABS
50.0000 mg | ORAL_TABLET | Freq: Every evening | ORAL | 3 refills | Status: DC | PRN
  Filled 2021-09-19: qty 90, 45d supply, fill #0

## 2021-09-19 MED ORDER — LOSARTAN POTASSIUM 50 MG PO TABS
50.0000 mg | ORAL_TABLET | Freq: Every day | ORAL | 1 refills | Status: DC
  Filled 2021-09-19 – 2022-01-15 (×2): qty 90, 90d supply, fill #0

## 2021-09-19 NOTE — Assessment & Plan Note (Signed)
Seen by neurology and Lyrica was added for peripheral neuropathy.  He never started this.  He is interested in trying this.  Will add this back on.

## 2021-09-19 NOTE — Assessment & Plan Note (Signed)
Prescribed Nurtec previously however he never tried this.  Will add back on to use as needed.

## 2021-09-19 NOTE — Assessment & Plan Note (Signed)
Blood pressure is mildly elevated today.  Readings are better at home.  Tolerating current medication well.  Will plan to continue.  He will continue weight loss efforts as well.

## 2021-09-19 NOTE — Progress Notes (Signed)
Matthew Daniel - 56 y.o. male MRN [id number]  Date of birth: [birth year]  Subjective Chief Complaint  Patient presents with   Hypertension    HPI Matthew Daniel is a 56 year old male here today for follow-up visit.  Reports overall he is doing well.  He has been exercising regularly and changing his diet.  Weight is down an additional 11 pounds since last visit.  He continues on losartan for management of hypertension.  He is tolerating this well.  He has not had chest pain, shortness of breath, palpitations, headaches or vision changes.  He did see neurology previously for neuropathy.  Prescribed Lyrica however he never started this.  He would like this prescription sent back in again.  Additionally he was prescribed Nurtec for migraines and never started this.  He would like to add this back on as well.  Continues on trazodone for sleep.  This works well for him.  ROS:  A comprehensive ROS was completed and negative except as noted per HPI  Allergies  Allergen Reactions   Hydrocodone-Acetaminophen Nausea Only   Oxycodone Nausea And Vomiting and Rash   Sulfa Antibiotics Hives and Rash    Past Medical History:  Diagnosis Date   Diverticulitis    History of kidney stones     Past Surgical History:  Procedure Laterality Date   COLONOSCOPY     EYE SURGERY     INCISION AND DRAINAGE PERIRECTAL ABSCESS N/A 03/20/2014   Procedure: EXAM UNDER ANESTHESIA with SUPERFICIAL FISTULOTOMY WITH MARSUPIALIZATION;  Surgeon: Michael Boston, MD;  Location: [facility];  Service: General;  Laterality: N/A;   REFRACTIVE SURGERY      Social History   Socioeconomic History   Marital status: Single    Spouse name: Not on file   Number of children: Not on file   Years of education: Not on file   Highest education level: Not on file  Occupational History   Occupation: Buyer, retail  Tobacco Use   Smoking status: Never   Smokeless tobacco: Never  Vaping Use   Vaping Use: Never used   Substance and Sexual Activity   Alcohol use: No    Alcohol/week: 0.0 standard drinks of alcohol   Drug use: No   Sexual activity: Not Currently    Partners: Female  Other Topics Concern   Not on file  Social History Narrative   Not on file   Social Determinants of Health   Financial Resource Strain: Not on file  Food Insecurity: Not on file  Transportation Needs: Not on file  Physical Activity: Not on file  Stress: Not on file  Social Connections: Not on file    Family History  Problem Relation Age of Onset   Lung cancer Mother 84   Hypertension Brother    Hypertension Sister     Health Maintenance  Topic Date Due   INFLUENZA VACCINE  09/10/2021   COVID-19 Vaccine (4 - [place] series) 12/20/2021 (Originally 03/15/2020)   TETANUS/TDAP  09/30/2023   COLONOSCOPY (Pts 45-22yr Insurance coverage will need to be confirmed)  06/06/2026   Hepatitis C Screening  Completed   HIV Screening  Completed   Zoster Vaccines- Shingrix  Completed   HPV VACCINES  Aged Out     ----------------------------------------------------------------------------------------------------------------------------------------------------------------------------------------------------------------- Physical Exam BP (!) 144/72 (BP Location: Left Arm, Patient Position: Sitting, Cuff Size: Large)   Pulse 74   Ht '6\' 1"'$  (1.854 m)   Wt 224 lb (101.6 kg)   SpO2 98%   BMI 29.55  kg/m   Physical Exam Constitutional:      Appearance: Normal appearance.  Eyes:     General: No scleral icterus. Cardiovascular:     Rate and Rhythm: Normal rate and regular rhythm.  Pulmonary:     Effort: Pulmonary effort is normal.     Breath sounds: Normal breath sounds.  Musculoskeletal:     Cervical back: Neck supple.  Neurological:     Mental Status: He is alert.  Psychiatric:        Mood and Affect: Mood normal.        Behavior: Behavior normal.      ------------------------------------------------------------------------------------------------------------------------------------------------------------------------------------------------------------------- Assessment and Plan  Essential hypertension Blood pressure is mildly elevated today.  Readings are better at home.  Tolerating current medication well.  Will plan to continue.  He will continue weight loss efforts as well.  Neuropathy Seen by neurology and Lyrica was added for peripheral neuropathy.  He never started this.  He is interested in trying this.  Will add this back on.   Chronic migraine without aura without status migrainosus, not intractable Prescribed Nurtec previously however he never tried this.  Will add back on to use as needed.  Insomnia Trazodone has worked well for him.  Will continue this.   Meds ordered this encounter  Medications   traZODone (DESYREL) 50 MG tablet    Sig: Take 1-2 tablets (50-100 mg total) by mouth at bedtime as needed for sleep.    Dispense:  90 tablet    Refill:  3   Rimegepant Sulfate (NURTEC) 75 MG TBDP    Sig: Dissolve 1 tablet by mouth daily as needed for migraine.    Dispense:  10 tablet    Refill:  6   pregabalin (LYRICA) 100 MG capsule    Sig: Take 1 capsule by mouth daily x 1 week , 1 capsule twice a day  x 1 week, then 1 capsule three times a day thereafter    Dispense:  90 capsule    Refill:  4   losartan (COZAAR) 50 MG tablet    Sig: Take 1 tablet by mouth daily.    Dispense:  90 tablet    Refill:  1    Return in about 6 months (around 03/22/2022) for HTN.    This visit occurred during the SARS-CoV-2 public health emergency.  Safety protocols were in place, including screening questions prior to the visit, additional usage of staff PPE, and extensive cleaning of exam room while observing appropriate contact time as indicated for disinfecting solutions.

## 2021-09-19 NOTE — Assessment & Plan Note (Signed)
Trazodone has worked well for him.  Will continue this.

## 2021-09-20 LAB — CBC WITH DIFFERENTIAL/PLATELET
Absolute Monocytes: 653 cells/uL (ref 200–950)
Basophils Absolute: 19 cells/uL (ref 0–200)
Basophils Relative: 0.6 %
Eosinophils Absolute: 38 cells/uL (ref 15–500)
Eosinophils Relative: 1.2 %
HCT: 39 % (ref 38.5–50.0)
Hemoglobin: 13 g/dL — ABNORMAL LOW (ref 13.2–17.1)
Lymphs Abs: 1530 cells/uL (ref 850–3900)
MCH: 30.2 pg (ref 27.0–33.0)
MCHC: 33.3 g/dL (ref 32.0–36.0)
MCV: 90.7 fL (ref 80.0–100.0)
MPV: 10.1 fL (ref 7.5–12.5)
Monocytes Relative: 20.4 %
Neutro Abs: 960 cells/uL — ABNORMAL LOW (ref 1500–7800)
Neutrophils Relative %: 30 %
Platelets: 221 10*3/uL (ref 140–400)
RBC: 4.3 10*6/uL (ref 4.20–5.80)
RDW: 13.9 % (ref 11.0–15.0)
Total Lymphocyte: 47.8 %
WBC: 3.2 10*3/uL — ABNORMAL LOW (ref 3.8–10.8)

## 2021-09-20 LAB — COMPLETE METABOLIC PANEL WITH GFR
AG Ratio: 1.8 (calc) (ref 1.0–2.5)
ALT: 50 U/L — ABNORMAL HIGH (ref 9–46)
AST: 187 U/L — ABNORMAL HIGH (ref 10–35)
Albumin: 4.2 g/dL (ref 3.6–5.1)
Alkaline phosphatase (APISO): 51 U/L (ref 35–144)
BUN/Creatinine Ratio: 8 (calc) (ref 6–22)
BUN: 11 mg/dL (ref 7–25)
CO2: 27 mmol/L (ref 20–32)
Calcium: 9 mg/dL (ref 8.6–10.3)
Chloride: 109 mmol/L (ref 98–110)
Creat: 1.45 mg/dL — ABNORMAL HIGH (ref 0.70–1.30)
Globulin: 2.3 g/dL (calc) (ref 1.9–3.7)
Glucose, Bld: 99 mg/dL (ref 65–99)
Potassium: 4.6 mmol/L (ref 3.5–5.3)
Sodium: 143 mmol/L (ref 135–146)
Total Bilirubin: 0.5 mg/dL (ref 0.2–1.2)
Total Protein: 6.5 g/dL (ref 6.1–8.1)
eGFR: 57 mL/min/{1.73_m2} — ABNORMAL LOW (ref >=60)

## 2021-09-20 LAB — LIPID PANEL W/REFLEX DIRECT LDL
Cholesterol: 170 mg/dL (ref <200)
HDL: 47 mg/dL (ref >=40)
LDL Cholesterol (Calc): 107 mg/dL (calc) — ABNORMAL HIGH
Non-HDL Cholesterol (Calc): 123 mg/dL (calc) (ref <130)
Total CHOL/HDL Ratio: 3.6 (calc) (ref <5.0)
Triglycerides: 73 mg/dL (ref <150)

## 2021-09-26 ENCOUNTER — Other Ambulatory Visit: Payer: Self-pay | Admitting: Family Medicine

## 2021-09-26 DIAGNOSIS — R7989 Other specified abnormal findings of blood chemistry: Secondary | ICD-10-CM

## 2021-10-24 ENCOUNTER — Other Ambulatory Visit: Payer: Self-pay

## 2021-10-24 DIAGNOSIS — N1831 Chronic kidney disease, stage 3a: Secondary | ICD-10-CM

## 2021-11-11 ENCOUNTER — Other Ambulatory Visit (HOSPITAL_COMMUNITY): Payer: Self-pay

## 2021-11-11 MED ORDER — PEG 3350-KCL-NA BICARB-NACL 420 G PO SOLR
ORAL | 0 refills | Status: DC
  Filled 2021-11-11: qty 4000, 1d supply, fill #0

## 2021-11-12 ENCOUNTER — Other Ambulatory Visit (HOSPITAL_COMMUNITY): Payer: Self-pay

## 2021-11-21 DIAGNOSIS — K5730 Diverticulosis of large intestine without perforation or abscess without bleeding: Secondary | ICD-10-CM | POA: Diagnosis not present

## 2021-11-21 DIAGNOSIS — D120 Benign neoplasm of cecum: Secondary | ICD-10-CM | POA: Diagnosis not present

## 2021-11-21 DIAGNOSIS — Z86010 Personal history of colonic polyps: Secondary | ICD-10-CM | POA: Diagnosis not present

## 2021-11-28 ENCOUNTER — Ambulatory Visit: Payer: 59

## 2021-11-28 DIAGNOSIS — R7989 Other specified abnormal findings of blood chemistry: Secondary | ICD-10-CM | POA: Diagnosis not present

## 2021-11-29 ENCOUNTER — Ambulatory Visit: Payer: 59 | Admitting: Sports Medicine

## 2021-11-29 LAB — HEPATIC FUNCTION PANEL
AG Ratio: 1.6 (calc) (ref 1.0–2.5)
ALT: 15 U/L (ref 9–46)
AST: 16 U/L (ref 10–35)
Albumin: 4.3 g/dL (ref 3.6–5.1)
Alkaline phosphatase (APISO): 52 U/L (ref 35–144)
Bilirubin, Direct: 0.1 mg/dL (ref 0.0–0.2)
Globulin: 2.7 g/dL (calc) (ref 1.9–3.7)
Indirect Bilirubin: 0.4 mg/dL (calc) (ref 0.2–1.2)
Total Bilirubin: 0.5 mg/dL (ref 0.2–1.2)
Total Protein: 7 g/dL (ref 6.1–8.1)

## 2022-01-15 ENCOUNTER — Other Ambulatory Visit (HOSPITAL_COMMUNITY): Payer: Self-pay

## 2022-03-20 ENCOUNTER — Encounter: Payer: Self-pay | Admitting: Pharmacist

## 2022-03-27 ENCOUNTER — Ambulatory Visit: Payer: Commercial Managed Care - PPO | Admitting: Family Medicine

## 2022-03-27 ENCOUNTER — Encounter: Payer: Self-pay | Admitting: Family Medicine

## 2022-03-27 VITALS — BP 128/81 | HR 77 | Ht 72.0 in | Wt 223.1 lb

## 2022-03-27 DIAGNOSIS — F5101 Primary insomnia: Secondary | ICD-10-CM | POA: Diagnosis not present

## 2022-03-27 DIAGNOSIS — I10 Essential (primary) hypertension: Secondary | ICD-10-CM

## 2022-03-27 DIAGNOSIS — G609 Hereditary and idiopathic neuropathy, unspecified: Secondary | ICD-10-CM

## 2022-03-27 DIAGNOSIS — N1831 Chronic kidney disease, stage 3a: Secondary | ICD-10-CM | POA: Diagnosis not present

## 2022-03-27 NOTE — Assessment & Plan Note (Signed)
BP is well controlled with losartan at current strength.  Continue losartan at current strength.

## 2022-03-27 NOTE — Assessment & Plan Note (Signed)
Remains on lyrica.  Continue at current strength.

## 2022-03-27 NOTE — Assessment & Plan Note (Signed)
Continue trazodone as needed. 

## 2022-03-27 NOTE — Progress Notes (Signed)
Matthew Daniel - 57 y.o. male MRN [id number]  Date of birth: Apr 05, 1965  Subjective Chief Complaint  Patient presents with   Follow-up    6 month f/u    HPI Matthew Daniel is a 57 y.o. male here today for follow up visit.   He continues on losartan 44m daily for management of HTN.  BP elevated today.  Denies side effects from medication.  He has not had chest pain, shortness of breath, palpitations,. headache or vision changes.   Continues on trazodone for sleep as needed.  Has not needed as frequently.   Neuropathy stable with lyrica.   ROS:  A comprehensive ROS was completed and negative except as noted per HPI  Allergies  Allergen Reactions   Hydrocodone-Acetaminophen Nausea Only   Oxycodone Nausea And Vomiting and Rash   Sulfa Antibiotics Hives and Rash    Past Medical History:  Diagnosis Date   Diverticulitis    History of kidney stones     Past Surgical History:  Procedure Laterality Date   COLONOSCOPY     EYE SURGERY     INCISION AND DRAINAGE PERIRECTAL ABSCESS N/A 03/20/2014   Procedure: EXAM UNDER ANESTHESIA with SUPERFICIAL FISTULOTOMY WITH MARSUPIALIZATION;  Surgeon: SMichael Boston MD;  Location: [facility];  Service: General;  Laterality: N/A;   REFRACTIVE SURGERY      Social History   Socioeconomic History   Marital status: Single    Spouse name: Not on file   Number of children: Not on file   Years of education: Not on file   Highest education level: Not on file  Occupational History   Occupation: [employer], retail Tobacco Use   Smoking status: Never   Smokeless tobacco: Never  Vaping Use   Vaping Use: Never used  Substance and Sexual Activity   Alcohol use: No    Alcohol/week: 0.0 standard drinks of alcohol   Drug use: No   Sexual activity: Not Currently    Partners: Female  Other Topics Concern   Not on file  Social History Narrative   Not on file   Social Determinants of Health   Financial Resource Strain: Not on file  Food  Insecurity: Not on file  Transportation Needs: Not on file  Physical Activity: Not on file  Stress: Not on file  Social Connections: Not on file    Family History  Problem Relation Age of Onset   Lung cancer Mother 470  Hypertension Brother    Hypertension Sister     Health Maintenance  Topic Date Due   COVID-19 Vaccine (4 - 2023-24 season) 04/12/2022 (Originally 10/11/2021)   DTaP/Tdap/Td (3 - Td or Tdap) 09/30/2023   COLONOSCOPY (Pts 45-461yrInsurance coverage will need to be confirmed)  06/06/2026   INFLUENZA VACCINE  Completed   Hepatitis C Screening  Completed   HIV Screening  Completed   Zoster Vaccines- Shingrix  Completed   HPV VACCINES  Aged Out     ----------------------------------------------------------------------------------------------------------------------------------------------------------------------------------------------------------------- Physical Exam BP 128/81 (BP Location: Left Arm, Patient Position: Sitting, Cuff Size: Normal)   Pulse 77   Ht 6' (1.829 m)   Wt 223 lb 1.3 oz (101.2 kg)   SpO2 99%   BMI 30.26 kg/m   Physical Exam Constitutional:      Appearance: Normal appearance.  HENT:     Head: Normocephalic and atraumatic.  Eyes:     General: No scleral icterus. Cardiovascular:     Rate and Rhythm: Normal rate and regular rhythm.  Pulmonary:     Effort: Pulmonary effort is normal.     Breath sounds: Normal breath sounds.  Musculoskeletal:     Cervical back: Neck supple.  Neurological:     Mental Status: He is alert.  Psychiatric:        Mood and Affect: Mood normal.        Behavior: Behavior normal.     ------------------------------------------------------------------------------------------------------------------------------------------------------------------------------------------------------------------- Assessment and Plan  Essential hypertension BP is well controlled with losartan at current strength.  Continue  losartan at current strength.    Peripheral neuropathy Remains on lyrica.  Continue at current strength.   CKD (chronic kidney disease), stage III ([place]) Renal function has remained stable.   Insomnia Continue trazodone as needed.    No orders of the defined types were placed in this encounter.   Return in about 6 months (around 09/25/2022) for HTN.    This visit occurred during the SARS-CoV-2 public health emergency.  Safety protocols were in place, including screening questions prior to the visit, additional usage of staff PPE, and extensive cleaning of exam room while observing appropriate contact time as indicated for disinfecting solutions.

## 2022-03-27 NOTE — Assessment & Plan Note (Signed)
Renal function has remained stable.

## 2022-03-28 ENCOUNTER — Other Ambulatory Visit: Payer: Self-pay | Admitting: Pharmacist

## 2022-03-28 NOTE — Progress Notes (Signed)
Patient appearing on report for [facility] - Hypertension Control report due to last documented ambulatory blood pressure of 144/72 on 09/19/21. Next appointment with PCP is 09/26/22   Outreached patient to discuss hypertension control and medication management.   Current antihypertensives: losartan 31m daily  Patient has an automated upper arm home BP machine.  Current blood pressure readings: "much lower at home than dr office"  Patient denies hypotensive signs and symptoms including dizziness, lightheadedness.  Patient denies hypertensive symptoms including headache, chest pain, shortness of breath.  Assessment/Plan: - Currently controlled - - Reviewed goal blood pressure <140/90 - Reviewed appropriate administration of medication regimen - Reviewed to check blood pressure periodically, document, and provide at next provider visit - Discussed goal of 150 minutes of moderate intensity physical activity weekly - Recommend continue current regimen  KLarinda Buttery PharmD Clinical Pharmacist [phone]

## 2022-03-28 NOTE — Patient Instructions (Addendum)
Matthew Daniel,  Thanks for taking my call today! As we discussed, keep up the great work with taking your blood pressure medicine as well as prioritizing nutrition and exercise into your lifestyle.  Since your blood pressures tend to run a bit higher in the doctor office than at home, it would be helpful to check (& write down) some numbers from home to provide Dr. Zigmund Daniel at your future visits.   Below is some helpful information about checking blood pressure:  Check your blood pressure periodically, and any time you have concerning symptoms like headache, chest pain, dizziness, shortness of breath, or vision changes.   Our goal is less than 140/90.  To appropriately check your blood pressure, make sure you do the following:  1) Avoid caffeine, exercise, or tobacco products for 30 minutes before checking. Empty your bladder. 2) Sit with your back supported in a flat-backed chair. Rest your arm on something flat (arm of the chair, table, etc). 3) Sit still with your feet flat on the floor, resting, for at least 5 minutes.  4) Check your blood pressure. Take 1-2 readings.  5) Write down these readings and bring with you to any provider appointments.  Bring your home blood pressure machine with you to a provider's office for accuracy comparison at least once a year.   Make sure you take your blood pressure medications before you come to any office visit, even if you were asked to fast for labs.   Take care and have a great weekend, Luana Shu, PharmD Clinical Pharmacist [facility] [phone]

## 2022-05-06 ENCOUNTER — Other Ambulatory Visit (HOSPITAL_COMMUNITY): Payer: Self-pay

## 2022-05-06 ENCOUNTER — Ambulatory Visit (HOSPITAL_COMMUNITY)
Admission: EM | Admit: 2022-05-06 | Discharge: 2022-05-06 | Disposition: A | Discharge status: Home / Self Care | Payer: Commercial Managed Care - PPO | Attending: Internal Medicine | Admitting: Internal Medicine

## 2022-05-06 ENCOUNTER — Encounter (HOSPITAL_COMMUNITY): Payer: Self-pay | Admitting: *Deleted

## 2022-05-06 DIAGNOSIS — B354 Tinea corporis: Secondary | ICD-10-CM | POA: Diagnosis not present

## 2022-05-06 DIAGNOSIS — R21 Rash and other nonspecific skin eruption: Secondary | ICD-10-CM

## 2022-05-06 MED ORDER — CLOTRIMAZOLE 1 % EX OINT
TOPICAL_OINTMENT | CUTANEOUS | 0 refills | Status: AC
  Filled 2022-05-06: qty 56.7, fill #0

## 2022-05-06 NOTE — Discharge Instructions (Signed)
Apply clotrimazole ointment to the lesion/rash twice daily for the next 7 to 10 days.  Schedule an appointment with your primary care provider for further evaluation of lesion should symptoms fail to improve in the next 1 to 2 weeks.  Keep your appointment with the dermatologist for July.  If you develop any new or worsening symptoms or do not improve in the next 2 to 3 days, please return.  If your symptoms are severe, please go to the emergency room.  Follow-up with your primary care provider for further evaluation and management of your symptoms as well as ongoing wellness visits.  I hope you feel better!

## 2022-05-06 NOTE — ED Triage Notes (Signed)
Pt states he has a rash on his left ankle since 11/2021. He has been using neosporin but no relief.

## 2022-05-06 NOTE — ED Provider Notes (Signed)
[place]    CSN: [id number] Arrival date & time: 05/06/22  1605      History   Chief Complaint Chief Complaint  Patient presents with   Rash    HPI Matthew Daniel is a 57 y.o. male.   Patient presents to urgent care for evaluation of rash to the medial left distal calf/proximal left ankle that started many months ago, however has worsened greatly over the last couple of weeks.  Rash consists of 1 lesion that is very itchy, circular, and red.  Denies recent known sick contacts with similar rash.  He wears socks that cover the lesion frequently.  He is not diabetic.  He has been using a couple of doses of Neosporin to the rash over the last couple of days without much relief.  Lesion is very itchy.     Past Medical History:  Diagnosis Date   Diverticulitis    History of kidney stones     Patient Active Problem List   Diagnosis Date Noted   Chronic migraine without aura without status migrainosus, not intractable 09/19/2021   Peripheral neuropathy 02/28/2021   Erectile dysfunction 01/31/2021   Decreased libido 12/20/2020   Essential hypertension 12/20/2020   Insomnia 12/20/2020   Well adult exam 05/20/2020   Lumbar degenerative disc disease 12/11/2019   CKD (chronic kidney disease), stage III ([place]) 10/08/2017   Diverticulosis of large intestine without hemorrhage 10/16/2016   History of renal stone 12/15/2014   History of colonic polyps 12/15/2014   History of hemorrhoids 12/15/2014   Gastroesophageal reflux disease without esophagitis 12/15/2014   History of diverticulitis 12/15/2014    Past Surgical History:  Procedure Laterality Date   COLONOSCOPY     EYE SURGERY     INCISION AND DRAINAGE PERIRECTAL ABSCESS N/A 03/20/2014   Procedure: EXAM UNDER ANESTHESIA with SUPERFICIAL FISTULOTOMY WITH MARSUPIALIZATION;  Surgeon: Michael Boston, MD;  Location: [facility];  Service: General;  Laterality: N/A;   REFRACTIVE SURGERY         Home Medications     Prior to Admission medications   Medication Sig Start Date End Date Taking? Authorizing Provider  cholecalciferol (VITAMIN D3) 25 MCG (1000 UNIT) tablet Take 1,000 Units by mouth daily.   Yes [provider]  Clotrimazole 1 % OINT Apply to affected area twice daily for the next 7 to 10 days. 05/06/22  Yes Talbot Grumbling, FNP  losartan (COZAAR) 50 MG tablet Take 1 tablet by mouth daily. 09/19/21  Yes Luetta Nutting, DO  Multiple Vitamin (MULTIVITAMIN) tablet Take 0.5 tablets by mouth 2 (two) times daily.    Yes [provider]  Omega-3 Fatty Acids (OMEGA 3 PO) Take by mouth.   Yes [provider]  traZODone (DESYREL) 50 MG tablet Take 1-2 tablets (50-100 mg total) by mouth at bedtime as needed for sleep. 09/19/21  Yes Luetta Nutting, DO  acetaminophen (TYLENOL) 650 MG CR tablet Take 1 tablet (650 mg total) by mouth every 8 (eight) hours as needed for pain. 02/28/21   Silverio Decamp, MD  polyethylene glycol-electrolytes (NULYTELY) 420 g solution Take as directed per package instructions. Patient not taking: Reported on 03/27/2022 09/05/21   Clarene Essex, MD  pregabalin (LYRICA) 100 MG capsule Take 1 capsule by mouth daily x 1 week , 1 capsule twice a day  x 1 week, then 1 capsule three times a day thereafter 09/19/21   Luetta Nutting, DO  Rimegepant Sulfate (NURTEC) 75 MG TBDP Dissolve 1  tablet by mouth daily as needed for migraine. Patient not taking: Reported on 03/27/2022 09/19/21   Luetta Nutting, DO  thiamine 100 MG tablet 1 po bid 07/04/21       Family History Family History  Problem Relation Age of Onset   Lung cancer Mother 36   Hypertension Brother    Hypertension Sister     Social History Social History   Tobacco Use   Smoking status: Never   Smokeless tobacco: Never  Vaping Use   Vaping Use: Never used  Substance Use Topics   Alcohol use: No    Alcohol/week: 0.0 standard drinks of alcohol   Drug use: No     Allergies    Hydrocodone-acetaminophen, Oxycodone, and Sulfa antibiotics   Review of Systems Review of Systems Per HPI  Physical Exam Triage Vital Signs ED Triage Vitals  Enc Vitals Group     BP 05/06/22 1630 (!) 153/94     Pulse Rate 05/06/22 1630 77     Resp 05/06/22 1630 18     Temp 05/06/22 1630 98.2 F (36.8 C)     Temp Source 05/06/22 1630 Oral     SpO2 05/06/22 1630 98 %     Weight --      Height --      Head Circumference --      Peak Flow --      Pain Score 05/06/22 1629 0     Pain Loc --      Pain Edu? --      Excl. in [place]? --    No data found.  Updated Vital Signs BP (!) 153/94 (BP Location: Left Arm)   Pulse 77   Temp 98.2 F (36.8 C) (Oral)   Resp 18   SpO2 98%   Visual Acuity Right Eye Distance:   Left Eye Distance:   Bilateral Distance:    Right Eye Near:   Left Eye Near:    Bilateral Near:     Physical Exam Vitals and nursing note reviewed.  Constitutional:      Appearance: He is not ill-appearing or toxic-appearing.  HENT:     Head: Normocephalic and atraumatic.     Right Ear: Hearing and external ear normal.     Left Ear: Hearing and external ear normal.     Nose: Nose normal.     Mouth/Throat:     Lips: Pink.  Eyes:     General: Lids are normal. Vision grossly intact. Gaze aligned appropriately.     Extraocular Movements: Extraocular movements intact.     Conjunctiva/sclera: Conjunctivae normal.  Pulmonary:     Effort: Pulmonary effort is normal.  Musculoskeletal:     Cervical back: Neck supple.  Skin:    General: Skin is warm and dry.     Capillary Refill: Capillary refill takes less than 2 seconds.     Findings: Lesion present.     Comments: Annular, erythematous, and pruritic lesion present to the medial proximal left calf/proximal left ankle as seen in image below.  No surrounding area of soft tissue swelling, warmth, or erythema.  No drainage from the lesion.  Lesion appears dry.  See image below for further details.  There appears to  be a nevus near the lesion as well.  Neurological:     General: No focal deficit present.     Mental Status: He is alert and oriented to person, place, and time. Mental status is at baseline.     Cranial Nerves: No  dysarthria or facial asymmetry.  Psychiatric:        Mood and Affect: Mood normal.        Speech: Speech normal.        Behavior: Behavior normal.        Thought Content: Thought content normal.        Judgment: Judgment normal.      UC Treatments / Results  Labs (all labs ordered are listed, but only abnormal results are displayed) Labs Reviewed - No data to display  EKG   Radiology No results found.  Procedures Procedures (including critical care time)  Medications Ordered in UC Medications - No data to display  Initial Impression / Assessment and Plan / UC Course  I have reviewed the triage vital signs and the nursing notes.  Pertinent labs & imaging results that were available during my care of the patient were reviewed by me and considered in my medical decision making (see chart for details).   1.  Tinea corporis, rash and nonspecific skin eruption Presentation is consistent with fungal lesion.  Will trial use of clotrimazole twice daily for the next 7 to 10 days to see if this helps with itching and lesion.  He has an appointment with dermatology in July 2024.  Advised to schedule an appointment with his PCP and follow-up with dermatology as scheduled.  He is to leave the lesion open to air at nighttime and avoid excessive moisture buildup to the region.   Discussed physical exam and available lab work findings in clinic with patient.  Counseled patient regarding appropriate use of medications and potential side effects for all medications recommended or prescribed today. Discussed red flag signs and symptoms of worsening condition,when to call the PCP office, return to urgent care, and when to seek higher level of care in the emergency department. Patient  verbalizes understanding and agreement with plan. All questions answered. Patient discharged in stable condition.    Final Clinical Impressions(s) / UC Diagnoses   Final diagnoses:  Rash and nonspecific skin eruption  Tinea corporis     Discharge Instructions      Apply clotrimazole ointment to the lesion/rash twice daily for the next 7 to 10 days.  Schedule an appointment with your primary care provider for further evaluation of lesion should symptoms fail to improve in the next 1 to 2 weeks.  Keep your appointment with the dermatologist for July.  If you develop any new or worsening symptoms or do not improve in the next 2 to 3 days, please return.  If your symptoms are severe, please go to the emergency room.  Follow-up with your primary care provider for further evaluation and management of your symptoms as well as ongoing wellness visits.  I hope you feel better!    ED Prescriptions     Medication Sig Dispense Auth. Provider   Clotrimazole 1 % OINT Apply to affected area twice daily for the next 7 to 10 days. 56.7 g Talbot Grumbling, FNP      PDMP not reviewed this encounter.   Talbot Grumbling, [place] Daniel 05/06/22 1725

## 2022-05-07 ENCOUNTER — Other Ambulatory Visit (HOSPITAL_COMMUNITY): Payer: Self-pay

## 2022-05-09 ENCOUNTER — Other Ambulatory Visit (HOSPITAL_COMMUNITY): Payer: Self-pay

## 2022-06-23 ENCOUNTER — Ambulatory Visit (INDEPENDENT_AMBULATORY_CARE_PROVIDER_SITE_OTHER): Payer: Commercial Managed Care - PPO | Admitting: Family Medicine

## 2022-06-23 ENCOUNTER — Encounter: Payer: Self-pay | Admitting: Family Medicine

## 2022-06-23 ENCOUNTER — Other Ambulatory Visit (HOSPITAL_COMMUNITY): Payer: Self-pay

## 2022-06-23 VITALS — BP 152/96 | HR 78 | Temp 98.3°F | Resp 16 | Ht 73.0 in | Wt 222.4 lb

## 2022-06-23 DIAGNOSIS — Z87442 Personal history of urinary calculi: Secondary | ICD-10-CM | POA: Diagnosis not present

## 2022-06-23 DIAGNOSIS — N1831 Chronic kidney disease, stage 3a: Secondary | ICD-10-CM

## 2022-06-23 DIAGNOSIS — Z86010 Personal history of colonic polyps: Secondary | ICD-10-CM | POA: Diagnosis not present

## 2022-06-23 DIAGNOSIS — I10 Essential (primary) hypertension: Secondary | ICD-10-CM | POA: Diagnosis not present

## 2022-06-23 DIAGNOSIS — Z0000 Encounter for general adult medical examination without abnormal findings: Secondary | ICD-10-CM

## 2022-06-23 DIAGNOSIS — Z8669 Personal history of other diseases of the nervous system and sense organs: Secondary | ICD-10-CM | POA: Diagnosis not present

## 2022-06-23 DIAGNOSIS — G609 Hereditary and idiopathic neuropathy, unspecified: Secondary | ICD-10-CM

## 2022-06-23 DIAGNOSIS — K219 Gastro-esophageal reflux disease without esophagitis: Secondary | ICD-10-CM

## 2022-06-23 DIAGNOSIS — Z125 Encounter for screening for malignant neoplasm of prostate: Secondary | ICD-10-CM

## 2022-06-23 DIAGNOSIS — G43709 Chronic migraine without aura, not intractable, without status migrainosus: Secondary | ICD-10-CM

## 2022-06-23 DIAGNOSIS — N529 Male erectile dysfunction, unspecified: Secondary | ICD-10-CM

## 2022-06-23 DIAGNOSIS — M5136 Other intervertebral disc degeneration, lumbar region: Secondary | ICD-10-CM | POA: Diagnosis not present

## 2022-06-23 LAB — LIPID PANEL

## 2022-06-23 LAB — CBC WITH DIFFERENTIAL/PLATELET
Immature Grans (Abs): 0 10*3/uL (ref 0.0–0.1)
Immature Granulocytes: 0 %
Lymphocytes Absolute: 1.6 10*3/uL (ref 0.7–3.1)
MCHC: 33.7 g/dL (ref 31.5–35.7)
Monocytes Absolute: 0.7 10*3/uL (ref 0.1–0.9)
Neutrophils Absolute: 2 10*3/uL (ref 1.4–7.0)
Neutrophils: 46 %

## 2022-06-23 LAB — COMPREHENSIVE METABOLIC PANEL
BUN/Creatinine Ratio: 10 (ref 9–20)
BUN: 16 mg/dL (ref 6–24)
CO2: 21 mmol/L (ref 20–29)
eGFR: 50 mL/min/{1.73_m2} — ABNORMAL LOW (ref >59)

## 2022-06-23 MED ORDER — LOSARTAN POTASSIUM 50 MG PO TABS
50.0000 mg | ORAL_TABLET | Freq: Every day | ORAL | 3 refills | Status: DC
Start: 2022-06-23 — End: 2023-07-01
  Filled 2022-06-23: qty 90, 90d supply, fill #0
  Filled 2022-09-22: qty 90, 90d supply, fill #1
  Filled 2022-12-21: qty 90, 90d supply, fill #2
  Filled 2023-04-27: qty 90, 90d supply, fill #3

## 2022-06-23 NOTE — Progress Notes (Signed)
Complete physical exam  Patient: Matthew Daniel   DOB: 1966/01/07   57 y.o. Male  MRN: [id number]  Subjective:    Chief Complaint  Patient presents with   Annual Exam    Fasting. No additional concerns.     Matthew Daniel is a 57 y.o. male who presents today for a complete physical exam. He reports consuming a general diet. Gym/ health club routine includes cardio and light weights. He generally feels fairly well. He reports sleeping poorly.  He uses trazodone on an as-needed basis to help with his sleep.  He rarely has difficulty with migraines headaches.  He is no longer having reflux symptoms and states this got better when he lost weight.  He is happy with the weight loss.  He was told he had a neuropathy however further history into this as the pain does tend to come and go and is related to standing for long periods of time.  He is taking losartan.  He was giving amlodipine in the past however it caused edema and he does not take that anymore.  He does have a history of colonic polyps and is scheduled for 5-year follow-up.  He does have a history of renal stones.  No longer is having difficulty with back pain.  He does have a lesion present on his left leg that he is using a steroid on.  He does have a history of elevated creatinine and has been seen in the past by nephrology.  Review of the record indicates that it has been very stable over the last several years.  He does not have additional problems to discuss today.  Otherwise his family and social history as well as health maintenance and immunizations was reviewed   Most recent fall risk assessment:    06/23/2022    8:55 AM  Fall Risk   Falls in the past year? 0  Number falls in past yr: 0  Injury with Fall? 0  Risk for fall due to : No Fall Risks  Follow up Falls evaluation completed     Most recent depression screenings:    06/23/2022    8:55 AM 03/27/2022    8:40 AM  PHQ 2/9 Scores  PHQ - 2 Score 0 0     Vision:Not within last year  and Dental: Receives regular dental care    Patient Care Team: Ronnald Nian, MD as PCP - General (Family Medicine)   Outpatient Medications Prior to Visit  Medication Sig   acetaminophen (TYLENOL) 650 MG CR tablet Take 1 tablet (650 mg total) by mouth every 8 (eight) hours as needed for pain.   cholecalciferol (VITAMIN D3) 25 MCG (1000 UNIT) tablet Take 1,000 Units by mouth daily.   Clotrimazole 1 % OINT Apply to affected area twice daily for the next 7 to 10 days.   Multiple Vitamin (MULTIVITAMIN) tablet Take 0.5 tablets by mouth 2 (two) times daily.    Omega-3 Fatty Acids (OMEGA 3 PO) Take by mouth.   traZODone (DESYREL) 50 MG tablet Take 1-2 tablets (50-100 mg total) by mouth at bedtime as needed for sleep.   [DISCONTINUED] losartan (COZAAR) 50 MG tablet Take 1 tablet by mouth daily.   [DISCONTINUED] polyethylene glycol-electrolytes (NULYTELY) 420 g solution Take as directed per package instructions. (Patient not taking: Reported on 03/27/2022)   [DISCONTINUED] pregabalin (LYRICA) 100 MG capsule Take 1 capsule by mouth daily x 1 week , 1 capsule twice a day  x 1 week,  then 1 capsule three times a day thereafter   [DISCONTINUED] Rimegepant Sulfate (NURTEC) 75 MG TBDP Dissolve 1 tablet by mouth daily as needed for migraine. (Patient not taking: Reported on 03/27/2022)   [DISCONTINUED] thiamine 100 MG tablet 1 po bid   No facility-administered medications prior to visit.    Review of Systems  All other systems reviewed and are negative.         Objective:    BP (!) 152/96 (BP Location: Right Arm, Cuff Size: Large)   Pulse 78   Temp 98.3 F (36.8 C) (Oral)   Resp 16   Ht 6\' 1"  (1.854 m)   Wt 222 lb 6.4 oz (100.9 kg)   SpO2 96% Comment: room air  BMI 29.34 kg/m    Physical Exam  Alert and in no distress. Tympanic membranes and canals are normal. Pharyngeal area is normal. Neck is supple without adenopathy or thyromegaly. Cardiac exam  shows a regular sinus rhythm without murmurs or gallops. Lungs are clear to auscultation. Exam of his feet shows normal sensation and pulses.      Assessment & Plan:    Routine general medical examination at a health care facility - Plan: CBC with Differential/Platelet, Comprehensive metabolic panel, Lipid panel  Essential hypertension - Plan: CBC with Differential/Platelet, Comprehensive metabolic panel, losartan (COZAAR) 50 MG tablet  History of migraine  Gastroesophageal reflux disease without esophagitis  Lumbar degenerative disc disease  Stage 3a chronic kidney disease (HCC)  History of renal stone  History of colonic polyps  Screening for prostate cancer - Plan: PSA  Immunization History  Administered Date(s) Administered   Hepatitis B 01/11/1991   Influenza,inj,Quad PF,6+ Mos 10/27/2013, 10/16/2016   Influenza-Unspecified 10/27/2013, 11/24/2014, 11/10/2020, 11/10/2021   MMR 03/24/1990   Measles 04/13/1996   PFIZER(Purple Top)SARS-COV-2 Vaccination 03/04/2019, 03/28/2019, 01/19/2020   PPD Test 05/17/2015   Td 01/26/2001   Tdap 09/29/2013   Zoster Recombinat (Shingrix) 12/20/2020, 03/21/2021    Health Maintenance  Topic Date Due   COVID-19 Vaccine (4 - 2023-24 season) 10/11/2021   INFLUENZA VACCINE  09/11/2022   DTaP/Tdap/Td (3 - Td or Tdap) 09/30/2023   COLONOSCOPY (Pts 45-6yrs Insurance coverage will need to be confirmed)  06/06/2026   Hepatitis C Screening  Completed   HIV Screening  Completed   Zoster Vaccines- Shingrix  Completed   HPV VACCINES  Aged Out    Discussed hypertension with him and he is to keep track of his blood pressure at home and if it stays above 130/80 he will call me and I will readjust his medications.  Discussed the renal issue with him and explained that I can certainly continue to follow this and as long as he stays in his present GFR range, no further intervention needed.  He was comfortable with that.  He will continue use  trazodone on an as-needed basis.  Continue on losartan.  He will follow-up with GI concerning his colonic polyps.  No intervention needed for the migraine and he really has no symptoms that I would say is truly neuropathy in nature.  I explained that if he indeed did have symptoms of a kidney stone to call me, I would give pain medications and then have him follow-up here in the office after that to do more definitive CT scanning.  Problem List Items Addressed This Visit     Chronic migraine without aura without status migrainosus, not intractable   Relevant Medications   losartan (COZAAR) 50 MG tablet   CKD (chronic  kidney disease), stage III (HCC)   Essential hypertension   Relevant Medications   losartan (COZAAR) 50 MG tablet   Other Relevant Orders   CBC with Differential/Platelet   Comprehensive metabolic panel   Gastroesophageal reflux disease without esophagitis   History of colonic polyps   History of renal stone   Lumbar degenerative disc disease   Other Visit Diagnoses     Routine general medical examination at a health care facility    -  Primary   Relevant Orders   CBC with Differential/Platelet   Comprehensive metabolic panel   Lipid panel   Screening for prostate cancer       Relevant Orders   PSA      Follow-up 1 year    Sharlot Gowda, MD

## 2022-06-23 NOTE — Patient Instructions (Signed)
Check your blood pressure at home and make sure that it stays 130/80 or lower and if not call me and we will readjust your meds

## 2022-06-24 LAB — COMPREHENSIVE METABOLIC PANEL
ALT: 14 IU/L (ref 0–44)
AST: 14 IU/L (ref 0–40)
Albumin/Globulin Ratio: 1.7 (ref 1.2–2.2)
Albumin: 4.3 g/dL (ref 3.8–4.9)
Alkaline Phosphatase: 60 IU/L (ref 44–121)
Bilirubin Total: 0.6 mg/dL (ref 0.0–1.2)
Calcium: 9.7 mg/dL (ref 8.7–10.2)
Chloride: 105 mmol/L (ref 96–106)
Creatinine, Ser: 1.61 mg/dL — ABNORMAL HIGH (ref 0.76–1.27)
Globulin, Total: 2.6 g/dL (ref 1.5–4.5)
Glucose: 104 mg/dL — ABNORMAL HIGH (ref 70–99)
Potassium: 5.5 mmol/L — ABNORMAL HIGH (ref 3.5–5.2)
Sodium: 141 mmol/L (ref 134–144)
Total Protein: 6.9 g/dL (ref 6.0–8.5)

## 2022-06-24 LAB — CBC WITH DIFFERENTIAL/PLATELET
Basophils Absolute: 0 10*3/uL (ref 0.0–0.2)
Basos: 1 %
EOS (ABSOLUTE): 0.1 10*3/uL (ref 0.0–0.4)
Eos: 1 %
Hematocrit: 38 % (ref 37.5–51.0)
Hemoglobin: 12.8 g/dL — ABNORMAL LOW (ref 13.0–17.7)
Lymphs: 37 %
MCH: 29.1 pg (ref 26.6–33.0)
MCV: 86 fL (ref 79–97)
Monocytes: 15 %
Platelets: 287 10*3/uL (ref 150–450)
RBC: 4.4 x10E6/uL (ref 4.14–5.80)
RDW: 13 % (ref 11.6–15.4)
WBC: 4.4 10*3/uL (ref 3.4–10.8)

## 2022-06-24 LAB — LIPID PANEL
Chol/HDL Ratio: 3.7 ratio (ref 0.0–5.0)
HDL: 47 mg/dL (ref >39)

## 2022-06-24 LAB — PSA: Prostate Specific Ag, Serum: 1.8 ng/mL (ref 0.0–4.0)

## 2022-06-25 DIAGNOSIS — N1831 Chronic kidney disease, stage 3a: Secondary | ICD-10-CM | POA: Diagnosis not present

## 2022-06-25 DIAGNOSIS — I10 Essential (primary) hypertension: Secondary | ICD-10-CM | POA: Diagnosis not present

## 2022-06-25 DIAGNOSIS — E559 Vitamin D deficiency, unspecified: Secondary | ICD-10-CM | POA: Diagnosis not present

## 2022-07-17 ENCOUNTER — Encounter: Payer: Self-pay | Admitting: Family Medicine

## 2022-07-18 ENCOUNTER — Other Ambulatory Visit (HOSPITAL_COMMUNITY): Payer: Self-pay

## 2022-07-18 ENCOUNTER — Ambulatory Visit: Payer: Commercial Managed Care - PPO | Admitting: Medical

## 2022-07-18 VITALS — BP 122/80 | HR 74 | Temp 97.9°F | Wt 218.0 lb

## 2022-07-18 DIAGNOSIS — B359 Dermatophytosis, unspecified: Secondary | ICD-10-CM

## 2022-07-18 DIAGNOSIS — R21 Rash and other nonspecific skin eruption: Secondary | ICD-10-CM

## 2022-07-18 MED ORDER — CLOTRIMAZOLE-BETAMETHASONE 1-0.05 % EX CREA
1.0000 | TOPICAL_CREAM | Freq: Every day | CUTANEOUS | 0 refills | Status: DC
  Filled 2022-07-18: qty 30, 30d supply, fill #0

## 2022-07-18 NOTE — Progress Notes (Signed)
Subjective:  Matthew Daniel is a 56 y.o. male who presents for Chief Complaint  Patient presents with   rash    Rash in October, getting worse, went to [facility] in march and was given Clotrimazole 1% and its not helping     Here for rash of left medial lower leg just above the ankle.  He was first seen in urgent care back in October for the same.  He was prescribed clotrimazole for ringworm.  The rash has improved but it just will not go away completely.  Where the rash was there is also an old lesion that has been there forever unchanged.  No other aggravating or relieving factors.    No other c/o.  The following portions of the patient's history were reviewed and updated as appropriate: allergies, current medications, past family history, past medical history, past social history, past surgical history and problem list.  ROS Otherwise as in subjective above  Objective: BP 122/80   Pulse 74   Temp 97.9 F (36.6 C)   Wt 218 lb (98.9 kg)   BMI 28.76 kg/m   General appearance: alert, no distress, well developed, well nourished Skin: Left lower leg medially above the ankle with 3 mm diameter somewhat slightly dense lesion suggestive of granuloma unchanged per patient for years, and surrounding that lesion is some faint pink discoloration approximately 1.2 cm diameter that is somewhat nonspecific.  However a picture in the chart record from urgent care from October shows the same area with more of a ringworm appearance   Assessment: Encounter Diagnoses  Name Primary?   Rash Yes   Ringworm      Plan: We discussed the skin findings.  The picture in the chart record from urgent care in October when he was seen by urgent care appears ringworm.  The rash is more faded today but still slightly present.  There is also an older unchanged lesion per patient suggestive of granuloma similar to a same size lesion on the left distal leg.  Begin medication below for the next 1 to 2 weeks.  If not  fully resolved within 2 weeks then recheck  [name] was seen today for rash.  Diagnoses and all orders for this visit:  Rash  Ringworm  Other orders -     clotrimazole-betamethasone (LOTRISONE) cream; Apply 1 Application topically daily.    Follow up: 2wk

## 2022-07-19 ENCOUNTER — Other Ambulatory Visit (HOSPITAL_COMMUNITY): Payer: Self-pay

## 2022-09-26 ENCOUNTER — Ambulatory Visit: Payer: Commercial Managed Care - PPO | Admitting: Family Medicine

## 2022-10-24 ENCOUNTER — Ambulatory Visit: Payer: Commercial Managed Care - PPO | Admitting: Family Medicine

## 2022-12-08 DIAGNOSIS — H04123 Dry eye syndrome of bilateral lacrimal glands: Secondary | ICD-10-CM | POA: Diagnosis not present

## 2022-12-08 DIAGNOSIS — H52223 Regular astigmatism, bilateral: Secondary | ICD-10-CM | POA: Diagnosis not present

## 2022-12-08 DIAGNOSIS — H2513 Age-related nuclear cataract, bilateral: Secondary | ICD-10-CM | POA: Diagnosis not present

## 2022-12-08 DIAGNOSIS — H5203 Hypermetropia, bilateral: Secondary | ICD-10-CM | POA: Diagnosis not present

## 2022-12-22 ENCOUNTER — Other Ambulatory Visit (HOSPITAL_COMMUNITY): Payer: Self-pay

## 2023-02-08 ENCOUNTER — Other Ambulatory Visit: Payer: Self-pay | Admitting: Medical

## 2023-02-09 ENCOUNTER — Other Ambulatory Visit (HOSPITAL_COMMUNITY): Payer: Self-pay

## 2023-02-09 ENCOUNTER — Other Ambulatory Visit: Payer: Self-pay

## 2023-02-09 MED ORDER — CLOTRIMAZOLE-BETAMETHASONE 1-0.05 % EX CREA
1.0000 | TOPICAL_CREAM | Freq: Every day | CUTANEOUS | 0 refills | Status: AC
  Filled 2023-02-09: qty 30, 30d supply, fill #0

## 2023-02-11 ENCOUNTER — Other Ambulatory Visit: Payer: Self-pay | Admitting: Family Medicine

## 2023-02-12 ENCOUNTER — Other Ambulatory Visit (HOSPITAL_COMMUNITY): Payer: Self-pay

## 2023-02-12 MED ORDER — TRAZODONE HCL 50 MG PO TABS
50.0000 mg | ORAL_TABLET | Freq: Every evening | ORAL | 3 refills | Status: DC | PRN
  Filled 2023-02-12: qty 90, 45d supply, fill #0

## 2023-04-07 ENCOUNTER — Encounter: Payer: Self-pay | Admitting: Internal Medicine

## 2023-05-04 ENCOUNTER — Other Ambulatory Visit (HOSPITAL_COMMUNITY): Payer: Self-pay

## 2023-07-02 ENCOUNTER — Encounter: Payer: Self-pay | Admitting: Family Medicine

## 2023-07-02 ENCOUNTER — Other Ambulatory Visit (HOSPITAL_COMMUNITY): Payer: Self-pay

## 2023-07-02 ENCOUNTER — Ambulatory Visit (INDEPENDENT_AMBULATORY_CARE_PROVIDER_SITE_OTHER): Payer: Commercial Managed Care - PPO | Admitting: Family Medicine

## 2023-07-02 VITALS — BP 138/90 | HR 85 | Ht 73.0 in | Wt 234.4 lb

## 2023-07-02 DIAGNOSIS — K219 Gastro-esophageal reflux disease without esophagitis: Secondary | ICD-10-CM

## 2023-07-02 DIAGNOSIS — Z13220 Encounter for screening for lipoid disorders: Secondary | ICD-10-CM

## 2023-07-02 DIAGNOSIS — Z0000 Encounter for general adult medical examination without abnormal findings: Secondary | ICD-10-CM

## 2023-07-02 DIAGNOSIS — Z87442 Personal history of urinary calculi: Secondary | ICD-10-CM | POA: Diagnosis not present

## 2023-07-02 DIAGNOSIS — Z8719 Personal history of other diseases of the digestive system: Secondary | ICD-10-CM

## 2023-07-02 DIAGNOSIS — I10 Essential (primary) hypertension: Secondary | ICD-10-CM | POA: Diagnosis not present

## 2023-07-02 DIAGNOSIS — Z860100 Personal history of colon polyps, unspecified: Secondary | ICD-10-CM

## 2023-07-02 DIAGNOSIS — G4709 Other insomnia: Secondary | ICD-10-CM

## 2023-07-02 DIAGNOSIS — K5730 Diverticulosis of large intestine without perforation or abscess without bleeding: Secondary | ICD-10-CM | POA: Diagnosis not present

## 2023-07-02 DIAGNOSIS — G6289 Other specified polyneuropathies: Secondary | ICD-10-CM

## 2023-07-02 DIAGNOSIS — N1831 Chronic kidney disease, stage 3a: Secondary | ICD-10-CM

## 2023-07-02 DIAGNOSIS — Z23 Encounter for immunization: Secondary | ICD-10-CM

## 2023-07-02 LAB — LIPID PANEL
Chol/HDL Ratio: 3.4 ratio (ref 0.0–5.0)
Cholesterol, Total: 163 mg/dL (ref 100–199)
HDL: 48 mg/dL (ref >39)
LDL Chol Calc (NIH): 102 mg/dL — ABNORMAL HIGH (ref 0–99)
Triglycerides: 65 mg/dL (ref 0–149)
VLDL Cholesterol Cal: 13 mg/dL (ref 5–40)

## 2023-07-02 LAB — COMPREHENSIVE METABOLIC PANEL WITH GFR
ALT: 16 IU/L (ref 0–44)
AST: 20 IU/L (ref 0–40)
Albumin: 4.2 g/dL (ref 3.8–4.9)
Alkaline Phosphatase: 64 IU/L (ref 44–121)
BUN/Creatinine Ratio: 9 (ref 9–20)
BUN: 14 mg/dL (ref 6–24)
Bilirubin Total: 0.6 mg/dL (ref 0.0–1.2)
CO2: 19 mmol/L — ABNORMAL LOW (ref 20–29)
Calcium: 9 mg/dL (ref 8.7–10.2)
Chloride: 106 mmol/L (ref 96–106)
Creatinine, Ser: 1.57 mg/dL — ABNORMAL HIGH (ref 0.76–1.27)
Globulin, Total: 2.5 g/dL (ref 1.5–4.5)
Glucose: 97 mg/dL (ref 70–99)
Potassium: 4.5 mmol/L (ref 3.5–5.2)
Sodium: 141 mmol/L (ref 134–144)
Total Protein: 6.7 g/dL (ref 6.0–8.5)
eGFR: 51 mL/min/{1.73_m2} — ABNORMAL LOW (ref >59)

## 2023-07-02 LAB — CBC WITH DIFFERENTIAL/PLATELET
Basophils Absolute: 0 10*3/uL (ref 0.0–0.2)
Basos: 1 %
EOS (ABSOLUTE): 0.1 10*3/uL (ref 0.0–0.4)
Eos: 1 %
Hematocrit: 38 % (ref 37.5–51.0)
Hemoglobin: 12.5 g/dL — ABNORMAL LOW (ref 13.0–17.7)
Immature Grans (Abs): 0 10*3/uL (ref 0.0–0.1)
Immature Granulocytes: 0 %
Lymphocytes Absolute: 1.5 10*3/uL (ref 0.7–3.1)
Lymphs: 34 %
MCH: 29.8 pg (ref 26.6–33.0)
MCHC: 32.9 g/dL (ref 31.5–35.7)
MCV: 91 fL (ref 79–97)
Monocytes Absolute: 0.7 10*3/uL (ref 0.1–0.9)
Monocytes: 17 %
Neutrophils Absolute: 2 10*3/uL (ref 1.4–7.0)
Neutrophils: 47 %
Platelets: 247 10*3/uL (ref 150–450)
RBC: 4.19 x10E6/uL (ref 4.14–5.80)
RDW: 14.4 % (ref 11.6–15.4)
WBC: 4.3 10*3/uL (ref 3.4–10.8)

## 2023-07-02 MED ORDER — LOSARTAN POTASSIUM 50 MG PO TABS
50.0000 mg | ORAL_TABLET | Freq: Every day | ORAL | 3 refills | Status: AC
  Filled 2023-07-02 – 2023-08-31 (×2): qty 90, 90d supply, fill #0
  Filled 2024-01-05: qty 90, 90d supply, fill #1

## 2023-07-02 MED ORDER — TRAZODONE HCL 50 MG PO TABS
50.0000 mg | ORAL_TABLET | Freq: Every evening | ORAL | 3 refills | Status: AC | PRN
Start: 2023-07-02 — End: ?
  Filled 2023-07-02: qty 90, 45d supply, fill #0

## 2023-07-02 NOTE — Progress Notes (Signed)
 Complete physical exam  Patient: Matthew Daniel   DOB: 01-Jun-1965   58 y.o. Male  MRN: [id number]  Subjective:     Chief Complaint  Patient presents with   Annual Exam    Cpe. Fasting.    Matthew Daniel is a 58 y.o. male who presents today for a complete physical exam.  He reports consuming a intermitting fasting. diet. Gym/ health club routine includes cardio. He generally feels fairly well. He reports sleeping poorly.  His job interferes with his sleep patterns and he continues on trazodone  to help with that.  Does have a previous history of diverticulitis as well as kidney stones.  Reflux is not causing any trouble.  He continues on losartan .  He does have a history of colonic polyps and is scheduled for repeat colonoscopy in 5 years.  Most recent fall risk assessment:    07/02/2023    8:58 AM  Fall Risk   Falls in the past year? 0  Number falls in past yr: 0  Injury with Fall? 0  Risk for fall due to : No Fall Risks  Follow up Falls evaluation completed     Most recent depression screenings:    07/02/2023    8:58 AM 06/23/2022    8:55 AM  PHQ 2/9 Scores  PHQ - 2 Score 0 0    Vision:Within last year and Dental: No current dental problems and Last dental visit: may  2025    Immunization History  Administered Date(s) Administered   Hepatitis B 01/11/1991   Influenza,inj,Quad PF,6+ Mos 10/27/2013, 10/16/2016   Influenza-Unspecified 10/27/2013, 11/24/2014, 11/10/2020, 11/10/2021   MMR 03/24/1990   Measles 04/13/1996   PFIZER(Purple Top)SARS-COV-2 Vaccination 03/04/2019, 03/28/2019, 01/19/2020   PNEUMOCOCCAL CONJUGATE-20 07/02/2023   PPD Test 05/17/2015   Td 01/26/2001   Tdap 09/29/2013, 07/02/2023   Zoster Recombinant(Shingrix) 12/20/2020, 03/21/2021    Health Maintenance  Topic Date Due   COVID-19 Vaccine (4 - 2024-25 season) 10/12/2022   INFLUENZA VACCINE  09/11/2023   Colonoscopy  11/22/2031   DTaP/Tdap/Td (4 - Td or Tdap) 07/01/2033    Pneumococcal Vaccine 55-58 Years old  Completed   Hepatitis C Screening  Completed   HIV Screening  Completed   Zoster Vaccines- Shingrix  Completed   HPV VACCINES  Aged Out   Meningococcal B Vaccine  Aged Out    Patient Care Team: Watson Hacking, MD as PCP - General (Family Medicine)   Outpatient Medications Prior to Visit  Medication Sig Note   acetaminophen  (TYLENOL ) 650 MG CR tablet Take 1 tablet (650 mg total) by mouth every 8 (eight) hours as needed for pain. 07/02/2023: As needed   cholecalciferol (VITAMIN D3) 25 MCG (1000 UNIT) tablet Take 1,000 Units by mouth daily.    Clotrimazole  1 % OINT Apply to affected area twice daily for the next 7 to 10 days. 07/02/2023: As needed   clotrimazole -betamethasone  (LOTRISONE ) cream Apply 1 Application topically daily. 07/02/2023: Prn    Multiple Vitamin (MULTIVITAMIN) tablet Take 0.5 tablets by mouth 2 (two) times daily.    Omega-3 Fatty Acids (OMEGA 3 PO) Take by mouth.    [DISCONTINUED] losartan  (COZAAR ) 50 MG tablet Take 1 tablet by mouth daily.    [DISCONTINUED] traZODone  (DESYREL ) 50 MG tablet Take 1-2 tablets (50-100 mg total) by mouth at bedtime as needed for sleep.    No facility-administered medications prior to visit.    Review of Systems  All other systems reviewed and are negative.   Family  and social history as well as health maintenance and immunizations was reviewed.     Objective:     Physical Exam  Alert and in no distress. Tympanic membranes and canals are normal. Pharyngeal area is normal. Neck is supple without adenopathy or thyromegaly. Cardiac exam shows a regular sinus rhythm without murmurs or gallops. Lungs are clear to auscultation.  Abdominal exam shows no masses or tenderness.      Assessment & Plan:   Routine general medical examination at a health care facility  Other polyneuropathy  Other insomnia - Plan: traZODone  (DESYREL ) 50 MG tablet  History of renal stone  History of  diverticulitis  History of colonic polyps  Gastroesophageal reflux disease without esophagitis  Essential hypertension - Plan: CBC with Differential/Platelet, Comprehensive metabolic panel with GFR, losartan  (COZAAR ) 50 MG tablet  Diverticulosis of large intestine without hemorrhage  History of hemorrhoids  Stage 3a chronic kidney disease (HCC)  Need for Tdap vaccination - Plan: Tdap vaccine greater than or equal to 7yo IM  Need for vaccination against Streptococcus pneumoniae - Plan: Pneumococcal conjugate vaccine 20-valent (Prevnar 20)  Screening for lipid disorders - Plan: Lipid panel Discussed calling me if he starts noticing difficulty with diverticulitis or with renal stones.  Continue other blood pressure medication.  His immunizations were updated.  Continue to use the topical medication for his polyneuropathy.  Return in about 1 year (around 07/01/2024).      Ron Cobbs, MD

## 2023-07-03 ENCOUNTER — Ambulatory Visit: Payer: Self-pay | Admitting: Family Medicine

## 2023-07-09 DIAGNOSIS — N1831 Chronic kidney disease, stage 3a: Secondary | ICD-10-CM | POA: Diagnosis not present

## 2023-07-09 DIAGNOSIS — I129 Hypertensive chronic kidney disease with stage 1 through stage 4 chronic kidney disease, or unspecified chronic kidney disease: Secondary | ICD-10-CM | POA: Diagnosis not present

## 2023-07-09 DIAGNOSIS — E559 Vitamin D deficiency, unspecified: Secondary | ICD-10-CM | POA: Diagnosis not present

## 2023-07-13 ENCOUNTER — Other Ambulatory Visit (HOSPITAL_COMMUNITY): Payer: Self-pay

## 2023-07-15 ENCOUNTER — Encounter: Payer: Self-pay | Admitting: Family Medicine

## 2023-07-17 ENCOUNTER — Other Ambulatory Visit (HOSPITAL_COMMUNITY): Payer: Self-pay

## 2023-07-17 ENCOUNTER — Telehealth (INDEPENDENT_AMBULATORY_CARE_PROVIDER_SITE_OTHER): Admitting: Medical

## 2023-07-17 VITALS — Ht 73.0 in | Wt 232.0 lb

## 2023-07-17 DIAGNOSIS — M79675 Pain in left toe(s): Secondary | ICD-10-CM

## 2023-07-17 DIAGNOSIS — J019 Acute sinusitis, unspecified: Secondary | ICD-10-CM

## 2023-07-17 MED ORDER — GUAIFENESIN ER 600 MG PO TB12
600.0000 mg | ORAL_TABLET | Freq: Two times a day (BID) | ORAL | 0 refills | Status: AC
  Filled 2023-07-17: qty 20, 10d supply, fill #0

## 2023-07-17 MED ORDER — AMOXICILLIN-POT CLAVULANATE 875-125 MG PO TABS
1.0000 | ORAL_TABLET | Freq: Two times a day (BID) | ORAL | 0 refills | Status: DC
Start: 2023-07-17 — End: 2023-08-19
  Filled 2023-07-17: qty 14, 7d supply, fill #0

## 2023-07-17 NOTE — Progress Notes (Signed)
 Subjective:     Patient ID: Matthew Daniel, male   DOB: 07/14/1965, 58 y.o.   MRN: [id number]  This visit type was conducted due to national recommendations for restrictions regarding the COVID-19 Pandemic (e.g. social distancing) in an effort to limit this patient's exposure and mitigate transmission in our community.  Due to their co-morbid illnesses, this patient is at least at moderate risk for complications without adequate follow up.  This format is felt to be most appropriate for this patient at this time.    Documentation for virtual audio and video telecommunications through [place] encounter:  The patient was located at home. The provider was located in the office. The patient did consent to this visit and is aware of possible charges through their insurance for this visit.  The other persons participating in this telemedicine service were none. Time spent on call was 20 minutes and in review of previous records 20 minutes total.  This virtual service is not related to other E/M service within previous 7 days.   HPI Chief Complaint  Patient presents with   Acute Visit    Sinus infection. Symptoms started Sunday.Monday- pressure on right nasal, blood from nose, drainage, HA, some chills, occasional cough.    Virtual for possible sinus infection  Has some sinus pressure, headaches, occasional cough, has some post nasal drainage, started about 5-6 days ago.  Has had some yellow nasal discharge, and a few times had blood tinged mucous.   Had some chills early on, but no fever.   No NVD.   No sick contacts.   No covid test.  Using some tylenol .  Water intake is good.  Nonsmoker.  No other aggravating or relieving factors.   He notes a tender area under his left second toe.  No injury or trauma.  It is hurts to touch.  No other complaint.  Past Medical History:  Diagnosis Date   Diverticulitis    History of kidney stones    Current Outpatient Medications on File Prior  to Visit  Medication Sig Dispense Refill   acetaminophen  (TYLENOL ) 650 MG CR tablet Take 1 tablet (650 mg total) by mouth every 8 (eight) hours as needed for pain. 90 tablet 3   cholecalciferol (VITAMIN D3) 25 MCG (1000 UNIT) tablet Take 1,000 Units by mouth daily.     Clotrimazole  1 % OINT Apply to affected area twice daily for the next 7 to 10 days. 56.7 g 0   clotrimazole -betamethasone  (LOTRISONE ) cream Apply 1 Application topically daily. 30 g 0   losartan  (COZAAR ) 50 MG tablet Take 1 tablet by mouth daily. 90 tablet 3   Multiple Vitamin (MULTIVITAMIN) tablet Take 0.5 tablets by mouth 2 (two) times daily.     Omega-3 Fatty Acids (OMEGA 3 PO) Take by mouth.     traZODone  (DESYREL ) 50 MG tablet Take 1-2 tablets (50-100 mg total) by mouth at bedtime as needed for sleep. 90 tablet 3   No current facility-administered medications on file prior to visit.     Review of Systems As in subjective    Objective:   Physical Exam Due to coronavirus pandemic stay at home measures, patient visit was virtual and they were not examined in person.   General: Well-developed well-nourished no acute distress Left volar second toe with no obvious corn or callus but the lighting in the video is somewhat difficult to know for sure, there is some bend to the toe that might suggest hammertoe     Assessment:  Encounter Diagnoses  Name Primary?   Acute non-recurrent sinusitis, unspecified location Yes   Pain of toe of left foot         Plan:     Sinusitis Medications prescribed today: Augmentin  antibiotic.   Make sure you complete the course of antibiotics.  Specific home care recommendations today include: Decongestant: You may use OTC Guaifenesin (Mucinex plain) for congestion.   Cough suppression: If you have cough from drainage, you may use over-the-counter Dextromethorphan (Delsym) as directed on the label.  Pain/fever relief: You may use over-the-counter Acetaminophen /Tylenol  for pain or  fever And not seeing improvement over the next 3-4 days despite changes above then begin the Augmentin   Patient voiced understanding of plan and recommendations above.  Patient was advised to call or recheck if not much improved or worse in the next 4-5 days.   Toe pain-could not fully appreciate the concern on the video.  It would be helpful to see this in person.  We discussed possibility of corn or plantar wart but I cannot make any distinct skin changes.  We also discussed the possibility of hammertoe.  Follow-up in person   Maxey was seen today for acute visit.  Diagnoses and all orders for this visit:  Acute non-recurrent sinusitis, unspecified location  Pain of toe of left foot  Other orders -     amoxicillin -clavulanate (AUGMENTIN ) 875-125 MG tablet; Take 1 tablet by mouth 2 (two) times daily. -     guaiFENesin (MUCINEX) 600 MG 12 hr tablet; Take 1 tablet (600 mg total) by mouth 2 (two) times daily.   Follow-up in person for toe issue

## 2023-07-20 NOTE — Progress Notes (Signed)
 Patient scheduled for in person visit

## 2023-07-22 ENCOUNTER — Ambulatory Visit: Admitting: Family Medicine

## 2023-08-19 ENCOUNTER — Encounter: Payer: Self-pay | Admitting: Family Medicine

## 2023-08-19 ENCOUNTER — Other Ambulatory Visit (HOSPITAL_COMMUNITY): Payer: Self-pay

## 2023-08-19 MED ORDER — AMOXICILLIN-POT CLAVULANATE 875-125 MG PO TABS
1.0000 | ORAL_TABLET | Freq: Two times a day (BID) | ORAL | 0 refills | Status: DC
  Filled 2023-08-19: qty 20, 10d supply, fill #0

## 2023-08-19 NOTE — Telephone Encounter (Signed)
 He has a previous history of diverticulitis and states that he has had increasing difficulty over the last 2 days with left lower quadrant pain, chills similar to previous episodes.  I will call in Augmentin  but warned him that if his symptoms worsen he will need to go to the hospital.

## 2023-08-31 ENCOUNTER — Other Ambulatory Visit (HOSPITAL_COMMUNITY): Payer: Self-pay

## 2023-10-13 ENCOUNTER — Encounter: Payer: Self-pay | Admitting: Sports Medicine

## 2023-11-03 ENCOUNTER — Other Ambulatory Visit (HOSPITAL_COMMUNITY): Payer: Self-pay

## 2023-11-03 MED ORDER — FLUZONE 0.5 ML IM SUSY
0.5000 mL | PREFILLED_SYRINGE | Freq: Once | INTRAMUSCULAR | 0 refills | Status: AC
  Filled 2023-11-03: qty 0.5, 1d supply, fill #0

## 2023-11-09 NOTE — Telephone Encounter (Signed)
 Lefty voicemail and sent My Chart message for appt

## 2023-11-11 ENCOUNTER — Encounter: Payer: Self-pay | Admitting: Family Medicine

## 2023-11-11 ENCOUNTER — Ambulatory Visit (INDEPENDENT_AMBULATORY_CARE_PROVIDER_SITE_OTHER): Admitting: Family Medicine

## 2023-11-11 VITALS — BP 124/78 | HR 82 | Ht 73.0 in | Wt 224.4 lb

## 2023-11-11 DIAGNOSIS — L02211 Cutaneous abscess of abdominal wall: Secondary | ICD-10-CM

## 2023-11-11 NOTE — Progress Notes (Signed)
   Subjective:    Patient ID: Matthew Daniel, male    DOB: 1965/06/04, 58 y.o.   MRN: [id number]  HPI He is here for evaluation of an abscess that started approximately 4 days ago in the lower mid abdominal area just superior to the pubis.  He thinks this may have started when he shaved the area.  Is quite painful.   Review of Systems     Objective:    Physical Exam Lower abdominal exam shows a 3 cm fluctuant discolored lesion is tender to palpation.       Assessment & Plan:  Abdominal wall abscess The lesion was injected with Xylocaine  and epinephrine .  A 2 cm incision was made and purulent material was expressed without difficulty.  It was explored and packed with iodoform.  He is to leave the packing in place for 2 days and then remove it.  Recommend irrigating it several times per day.  Do not feel an antibiotic is needed as he had no surrounding erythema or induration.

## 2023-11-12 ENCOUNTER — Encounter: Payer: Self-pay | Admitting: Family Medicine

## 2023-11-13 NOTE — Telephone Encounter (Signed)
 Hi Mckenzie,  Dr. Joyce is out of the office today, I am his nurse and I was in the room with you that day. It was just one long strip inserted, and the band aid over top of the incision.I hope your doing well. Have a great weekend.

## 2024-01-12 DIAGNOSIS — M79604 Pain in right leg: Secondary | ICD-10-CM | POA: Diagnosis not present

## 2024-01-12 DIAGNOSIS — M79605 Pain in left leg: Secondary | ICD-10-CM | POA: Diagnosis not present

## 2024-01-15 ENCOUNTER — Other Ambulatory Visit (HOSPITAL_COMMUNITY): Payer: Self-pay

## 2024-01-15 DIAGNOSIS — E559 Vitamin D deficiency, unspecified: Secondary | ICD-10-CM | POA: Diagnosis not present

## 2024-01-15 DIAGNOSIS — Z1159 Encounter for screening for other viral diseases: Secondary | ICD-10-CM | POA: Diagnosis not present

## 2024-01-15 DIAGNOSIS — Z125 Encounter for screening for malignant neoplasm of prostate: Secondary | ICD-10-CM | POA: Diagnosis not present

## 2024-01-15 DIAGNOSIS — R0602 Shortness of breath: Secondary | ICD-10-CM | POA: Diagnosis not present

## 2024-01-15 DIAGNOSIS — Z114 Encounter for screening for human immunodeficiency virus [HIV]: Secondary | ICD-10-CM | POA: Diagnosis not present

## 2024-01-15 DIAGNOSIS — E291 Testicular hypofunction: Secondary | ICD-10-CM | POA: Diagnosis not present

## 2024-01-15 DIAGNOSIS — D539 Nutritional anemia, unspecified: Secondary | ICD-10-CM | POA: Diagnosis not present

## 2024-01-15 DIAGNOSIS — R5383 Other fatigue: Secondary | ICD-10-CM | POA: Diagnosis not present

## 2024-01-15 DIAGNOSIS — Z0000 Encounter for general adult medical examination without abnormal findings: Secondary | ICD-10-CM | POA: Diagnosis not present

## 2024-01-15 MED ORDER — LOSARTAN POTASSIUM 50 MG PO TABS
50.0000 mg | ORAL_TABLET | Freq: Every day | ORAL | 1 refills | Status: AC
  Filled 2024-01-15 – 2024-01-25 (×2): qty 90, 90d supply, fill #0

## 2024-01-21 ENCOUNTER — Other Ambulatory Visit (HOSPITAL_COMMUNITY): Payer: Self-pay

## 2024-01-21 ENCOUNTER — Other Ambulatory Visit: Payer: Self-pay

## 2024-01-21 MED ORDER — FERROUS GLUCONATE 324 (38 FE) MG PO TABS
324.0000 mg | ORAL_TABLET | Freq: Every day | ORAL | 1 refills | Status: AC
  Filled 2024-01-21: qty 90, 90d supply, fill #0

## 2024-01-25 ENCOUNTER — Other Ambulatory Visit (HOSPITAL_COMMUNITY): Payer: Self-pay

## 2024-01-26 ENCOUNTER — Other Ambulatory Visit (HOSPITAL_COMMUNITY): Payer: Self-pay

## 2024-07-07 ENCOUNTER — Encounter: Payer: Self-pay | Admitting: Family Medicine
